# Patient Record
Sex: Female | Born: 1965 | Race: White | Hispanic: No | Marital: Married | Smoking: Never smoker
Health system: Southern US, Community
[De-identification: ages and names within clinical notes are randomized; demographics above are authoritative.]

## PROBLEM LIST (undated history)

## (undated) DIAGNOSIS — F32A Depression, unspecified: Secondary | ICD-10-CM

## (undated) DIAGNOSIS — F419 Anxiety disorder, unspecified: Secondary | ICD-10-CM

## (undated) DIAGNOSIS — T7840XA Allergy, unspecified, initial encounter: Secondary | ICD-10-CM

## (undated) DIAGNOSIS — D2362 Other benign neoplasm of skin of left upper limb, including shoulder: Secondary | ICD-10-CM

## (undated) DIAGNOSIS — L719 Rosacea, unspecified: Secondary | ICD-10-CM

## (undated) DIAGNOSIS — F329 Major depressive disorder, single episode, unspecified: Secondary | ICD-10-CM

## (undated) HISTORY — DX: Anxiety disorder, unspecified: F41.9

## (undated) HISTORY — DX: Allergy, unspecified, initial encounter: T78.40XA

## (undated) HISTORY — PX: COSMETIC SURGERY: SHX468

## (undated) HISTORY — PX: COLONOSCOPY: SHX174

## (undated) HISTORY — PX: ABDOMINAL HYSTERECTOMY: SHX81

## (undated) HISTORY — DX: Depression, unspecified: F32.A

## (undated) HISTORY — PX: LAPAROSCOPY: SHX197

## (undated) HISTORY — DX: Major depressive disorder, single episode, unspecified: F32.9

## (undated) HISTORY — DX: Rosacea, unspecified: L71.9

## (undated) HISTORY — DX: Other benign neoplasm of skin of left upper limb, including shoulder: D23.62

---
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
## 1985-03-21 HISTORY — PX: PLACEMENT OF BREAST IMPLANTS: SHX6334

## 1998-06-05 ENCOUNTER — Observation Stay (HOSPITAL_COMMUNITY): Admission: AD | Admit: 1998-06-05 | Discharge: 1998-06-06 | Payer: Self-pay | Admitting: Obstetrics and Gynecology

## 1998-06-09 ENCOUNTER — Inpatient Hospital Stay (HOSPITAL_COMMUNITY): Admission: AD | Admit: 1998-06-09 | Discharge: 1998-06-13 | Payer: Self-pay | Admitting: Obstetrics and Gynecology

## 1998-06-13 ENCOUNTER — Encounter (HOSPITAL_COMMUNITY): Admission: RE | Admit: 1998-06-13 | Discharge: 1998-09-11 | Payer: Self-pay | Admitting: Obstetrics and Gynecology

## 1999-06-11 ENCOUNTER — Ambulatory Visit (HOSPITAL_COMMUNITY): Admission: RE | Admit: 1999-06-11 | Discharge: 1999-06-11 | Payer: Self-pay | Admitting: *Deleted

## 1999-08-26 ENCOUNTER — Other Ambulatory Visit: Admission: RE | Admit: 1999-08-26 | Discharge: 1999-08-26 | Payer: Self-pay | Admitting: Obstetrics and Gynecology

## 2000-06-03 ENCOUNTER — Inpatient Hospital Stay (HOSPITAL_COMMUNITY): Admission: AD | Admit: 2000-06-03 | Discharge: 2000-06-03 | Payer: Self-pay | Admitting: Obstetrics and Gynecology

## 2000-08-31 ENCOUNTER — Inpatient Hospital Stay (HOSPITAL_COMMUNITY): Admission: AD | Admit: 2000-08-31 | Discharge: 2000-09-03 | Payer: Self-pay | Admitting: Obstetrics and Gynecology

## 2000-10-12 ENCOUNTER — Other Ambulatory Visit: Admission: RE | Admit: 2000-10-12 | Discharge: 2000-10-12 | Payer: Self-pay | Admitting: Obstetrics and Gynecology

## 2001-10-18 ENCOUNTER — Other Ambulatory Visit: Admission: RE | Admit: 2001-10-18 | Discharge: 2001-10-18 | Payer: Self-pay | Admitting: Obstetrics and Gynecology

## 2002-03-21 HISTORY — PX: VAGINAL HYSTERECTOMY: SHX2639

## 2002-10-23 ENCOUNTER — Other Ambulatory Visit: Admission: RE | Admit: 2002-10-23 | Discharge: 2002-10-23 | Payer: Self-pay | Admitting: Obstetrics and Gynecology

## 2003-01-28 ENCOUNTER — Inpatient Hospital Stay (HOSPITAL_COMMUNITY): Admission: AD | Admit: 2003-01-28 | Discharge: 2003-01-31 | Payer: Self-pay | Admitting: Obstetrics and Gynecology

## 2003-01-28 ENCOUNTER — Encounter (INDEPENDENT_AMBULATORY_CARE_PROVIDER_SITE_OTHER): Payer: Self-pay | Admitting: Specialist

## 2004-01-06 ENCOUNTER — Other Ambulatory Visit: Admission: RE | Admit: 2004-01-06 | Discharge: 2004-01-06 | Payer: Self-pay | Admitting: Obstetrics and Gynecology

## 2004-06-17 ENCOUNTER — Encounter: Admission: RE | Admit: 2004-06-17 | Discharge: 2004-06-17 | Payer: Self-pay | Admitting: Obstetrics and Gynecology

## 2005-02-02 ENCOUNTER — Other Ambulatory Visit: Admission: RE | Admit: 2005-02-02 | Discharge: 2005-02-02 | Payer: Self-pay | Admitting: Obstetrics and Gynecology

## 2006-03-02 ENCOUNTER — Encounter: Admission: RE | Admit: 2006-03-02 | Discharge: 2006-03-02 | Payer: Self-pay | Admitting: Obstetrics and Gynecology

## 2007-03-26 ENCOUNTER — Encounter: Admission: RE | Admit: 2007-03-26 | Discharge: 2007-03-26 | Payer: Self-pay | Admitting: Obstetrics and Gynecology

## 2007-04-27 ENCOUNTER — Ambulatory Visit: Payer: Self-pay | Admitting: Internal Medicine

## 2007-04-27 DIAGNOSIS — F411 Generalized anxiety disorder: Secondary | ICD-10-CM

## 2007-04-27 DIAGNOSIS — M79609 Pain in unspecified limb: Secondary | ICD-10-CM

## 2007-04-27 DIAGNOSIS — F419 Anxiety disorder, unspecified: Secondary | ICD-10-CM | POA: Insufficient documentation

## 2008-05-05 ENCOUNTER — Encounter: Admission: RE | Admit: 2008-05-05 | Discharge: 2008-05-05 | Payer: Self-pay | Admitting: Obstetrics and Gynecology

## 2008-06-06 ENCOUNTER — Ambulatory Visit: Payer: Self-pay | Admitting: Internal Medicine

## 2008-06-06 DIAGNOSIS — M542 Cervicalgia: Secondary | ICD-10-CM

## 2008-07-01 ENCOUNTER — Encounter: Payer: Self-pay | Admitting: Internal Medicine

## 2008-07-30 ENCOUNTER — Encounter
Admission: RE | Admit: 2008-07-30 | Discharge: 2008-10-28 | Payer: Self-pay | Admitting: Physical Medicine & Rehabilitation

## 2008-08-04 ENCOUNTER — Ambulatory Visit: Payer: Self-pay | Admitting: Physical Medicine & Rehabilitation

## 2008-08-07 ENCOUNTER — Encounter
Admission: RE | Admit: 2008-08-07 | Discharge: 2008-11-04 | Payer: Self-pay | Admitting: Physical Medicine & Rehabilitation

## 2008-08-21 ENCOUNTER — Ambulatory Visit (HOSPITAL_BASED_OUTPATIENT_CLINIC_OR_DEPARTMENT_OTHER)
Admission: RE | Admit: 2008-08-21 | Discharge: 2008-08-21 | Payer: Self-pay | Admitting: Physical Medicine & Rehabilitation

## 2008-08-21 ENCOUNTER — Ambulatory Visit: Payer: Self-pay | Admitting: Diagnostic Radiology

## 2008-09-09 ENCOUNTER — Ambulatory Visit: Payer: Self-pay | Admitting: Physical Medicine & Rehabilitation

## 2008-10-21 ENCOUNTER — Ambulatory Visit: Payer: Self-pay | Admitting: Physical Medicine & Rehabilitation

## 2009-05-11 ENCOUNTER — Encounter: Admission: RE | Admit: 2009-05-11 | Discharge: 2009-05-11 | Payer: Self-pay | Admitting: Obstetrics and Gynecology

## 2009-08-21 ENCOUNTER — Ambulatory Visit: Payer: Self-pay | Admitting: Internal Medicine

## 2009-08-21 DIAGNOSIS — R1013 Epigastric pain: Secondary | ICD-10-CM

## 2009-08-21 DIAGNOSIS — K3189 Other diseases of stomach and duodenum: Secondary | ICD-10-CM | POA: Insufficient documentation

## 2009-08-21 LAB — CONVERTED CEMR LAB
AST: 18 units/L (ref 0–37)
Alkaline Phosphatase: 107 units/L (ref 39–117)
Amylase: 49 units/L (ref 27–131)
Basophils Absolute: 0 10*3/uL (ref 0.0–0.1)
Bilirubin Urine: NEGATIVE
CO2: 31 meq/L (ref 19–32)
Chloride: 103 meq/L (ref 96–112)
Creatinine, Ser: 0.7 mg/dL (ref 0.4–1.2)
Eosinophils Relative: 6.4 % — ABNORMAL HIGH (ref 0.0–5.0)
GFR calc non Af Amer: 104.37 mL/min (ref >60)
HCT: 41.9 % (ref 36.0–46.0)
Ketones, urine, test strip: NEGATIVE
Lymphocytes Relative: 21.1 % (ref 12.0–46.0)
MCHC: 34.7 g/dL (ref 30.0–36.0)
Neutrophils Relative %: 66.3 % (ref 43.0–77.0)
Platelets: 379 10*3/uL (ref 150.0–400.0)
RDW: 12.6 % (ref 11.5–14.6)
Sodium: 140 meq/L (ref 135–145)
Total Bilirubin: 0.4 mg/dL (ref 0.3–1.2)

## 2009-08-23 ENCOUNTER — Telehealth (INDEPENDENT_AMBULATORY_CARE_PROVIDER_SITE_OTHER): Payer: Self-pay | Admitting: *Deleted

## 2009-08-25 ENCOUNTER — Ambulatory Visit: Payer: Self-pay | Admitting: Internal Medicine

## 2009-08-26 ENCOUNTER — Encounter: Admission: RE | Admit: 2009-08-26 | Discharge: 2009-08-26 | Payer: Self-pay | Admitting: Internal Medicine

## 2009-08-28 ENCOUNTER — Telehealth (INDEPENDENT_AMBULATORY_CARE_PROVIDER_SITE_OTHER): Payer: Self-pay | Admitting: *Deleted

## 2009-09-04 ENCOUNTER — Ambulatory Visit: Payer: Self-pay | Admitting: Internal Medicine

## 2010-02-04 ENCOUNTER — Encounter: Payer: Self-pay | Admitting: Internal Medicine

## 2010-03-21 HISTORY — PX: BREAST CYST ASPIRATION: SHX578

## 2010-04-10 ENCOUNTER — Other Ambulatory Visit: Payer: Self-pay | Admitting: Obstetrics and Gynecology

## 2010-04-10 DIAGNOSIS — Z1239 Encounter for other screening for malignant neoplasm of breast: Secondary | ICD-10-CM

## 2010-04-11 ENCOUNTER — Encounter: Payer: Self-pay | Admitting: Internal Medicine

## 2010-04-11 ENCOUNTER — Encounter: Payer: Self-pay | Admitting: Obstetrics and Gynecology

## 2010-04-22 NOTE — Assessment & Plan Note (Signed)
Summary: rto 2 weeks.cbs   Vital Signs:  Patient profile:   45 year old female Height:      63 inches Weight:      144 pounds Temp:     99.0 degrees F oral Pulse rate:   78 / minute BP sitting:   100 / 82  (left arm)  Vitals Entered By: Jeremy Johann CMA (September 04, 2009 1:53 PM) CC: 2 week f/u Comments --better no more episodes REVIEWED MED LIST, PATIENT AGREED DOSE AND INSTRUCTION CORRECT    History of Present Illness: followup from previous visit Upper abdominal crampy pain resolved No diarrhea She decided not to take Nexium  Allergies: 1)  ! * Cephalasporin 2)  ! Pcn  Past History:  Past Medical History: Reviewed history from 06/06/2008 and no changes required. 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 Hysterectomy (2004) no oophorectomy  Social History: Reviewed history from 06/06/2008 and no changes required. 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  Physical Exam  General:  alert, well-developed, and well-nourished.   Abdomen:  soft, non-tender, no distention, no masses, no guarding, and no rigidity.     Impression & Recommendations:  Problem # 1:  DYSPEPSIA (ICD-536.8) improving upper crampy abdominal pain resolved Still bloated from time to time Workup negative except for x-ray that showed abundant feces. We discussed a healthy diet, avoid the type of foods that cause bloating. recommend to avoid excessive fiber supplements, always take fiber w/  water, try to eat more fruits and vegetables. The patient will let me know if she's not completely well in 2 months for a GI referral. EGD/colonoscopy?  Complete Medication List: 1)  Effexor Xr  75 Mg Cp24 (Venlafaxine hcl) .Marland Kitchen.. 1 by mouth qd 2)  Retin-a  3)  Finacea 15 % Gel (Azelaic acid) 4)  Oracea 40 Mg Cpdr (Doxycycline (rosacea)) .... Take once daily 5)  Wellbutrin 75 Mg Tabs (Bupropion hcl) .... Take 1 tab once daily

## 2010-04-22 NOTE — Progress Notes (Signed)
Summary: lab result  Phone Note Outgoing Call   Call placed by: [facility] CMA,  August 28, 2009 10:28 AM Details for Reason: advised patient X-rays an ultrasound are essentially normal (?constipation) plan is the same, come back in 2 weeks if she feels constipated, needs to take milk of magnesia over-the-counter as needed Summary of Call: left message to call office..........Marland KitchenFelecia Deloach CMA  August 28, 2009 10:28 AM  DISCUSS WITH PATIENT, letter mailed ..........Marland KitchenFelecia Deloach CMA  August 28, 2009 12:06 PM

## 2010-04-22 NOTE — Assessment & Plan Note (Signed)
Summary: stomach issues//lch   Vital Signs:  Patient profile:   45 year old female Height:      63 inches Weight:      141 pounds BMI:     25.07 Temp:     98.8 degrees F oral Pulse rate:   66 / minute BP sitting:   122 / 98  (left arm)  Vitals Entered By: Jeremy Johann CMA (August 21, 2009 9:42 AM) CC: stomach pain/ cramping x43month Comments --NVD --bloating --chills and sweats -REVIEWED MED LIST, PATIENT AGREED DOSE AND INSTRUCTION CORRECT    History of Present Illness: one month history of on and off upper abdominal pain The pain is located in the center of the upper abdomen, crampy, may last hours, apparently not triggered by any particular food. She also had diarrhea on and off, less frequent than the pain. pain is associated with upper abdominal bloating Stools are nonbloody and watery. She had nausea and vomiting for the first time yesterday. The only new medication that she is taking  Oracea  for rosacea  review of systems: Denies fevers No weight loss No GERD symptoms, stools normal in color, no blood in the stools. No  classic heartburn No dysuria or gross hematuria Denies using Motrin or Motrin-like medicines She had a hysterectomy, some hot flashes  Allergies: 1)  ! * Cephalasporin 2)  ! Pcn  Past History:  Past Medical History: Reviewed history from 06/06/2008 and no changes required. 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  no apparent distress Eyes:  not pale or jaundice Lungs:  clear  bilaterally Heart:  regular rate and rhythm without murmur Abdomen:  not distended, soft, tender at the epigastric area without mass or  rebound. Not tender at the lower aspect of the abdomen. No tender at the right upper quadrant. Bowel sounds normal, slightly metallic?   Impression & Recommendations:  Problem # 1:  DYSPEPSIA (ICD-536.8) on an off upper abdominal cramping with epigastric tenderness for the last month She has developed nausea and vomiting for only one day GERD? Peptic ulcer disease? Gallbladder? Plan: Abdominal x-rays Ultrasound Trial with PPIs Labs ER if symptoms severe Return to the office in 2 weeks  Orders: T-Abdomen 2-view (74020TC) Venipuncture (57846) TLB-BMP (Basic Metabolic Panel-BMET) (80048-METABOL) TLB-CBC Platelet - w/Differential (85025-CBCD) TLB-Hepatic/Liver Function Pnl (80076-HEPATIC) TLB-Lipase (83690-LIPASE) TLB-Amylase (82150-AMYL) UA Dipstick w/o Micro (manual) (96295) Radiology Referral (Radiology)  Complete Medication List: 1)  Effexor Xr 75 Mg Cp24 (Venlafaxine hcl) .Marland Kitchen.. 1 by mouth qd 2)  Retin-a  3)  Finacea 15 % Gel (Azelaic acid) 4)  Oracea 40 Mg Cpdr (Doxycycline (rosacea)) .... Take once daily 5)  Wellbutrin 75 Mg Tabs (Bupropion hcl) .... Take 1 tab once daily 6)  Nexium 40 Mg Cpdr (Esomeprazole magnesium) .... One by mouth daily before breakfast  Patient Instructions: 1)  Please schedule a follow-up appointment in 2 weeks.  Prescriptions: NEXIUM 40 MG CPDR (ESOMEPRAZOLE MAGNESIUM) one by mouth daily before breakfast  #30 x 1   Entered and Authorized by:   Elita Quick E. Cohen Doleman MD   Signed by:   Nolon Rod. Camryn Lampson MD on 08/21/2009   Method used:   Print  then Give to Patient   RxID:   [id number]   Laboratory Results   Urine Tests   Date/Time Reported: August 21, 2009 10:26 AM  Routine Urinalysis   Color: yellow Appearance: Clear Glucose: negative   (Normal Range: Negative) Bilirubin: negative   (Normal Range: Negative) Ketone: negative   (Normal Range: Negative) Spec. Gravity: 1.010   (Normal Range: 1.003-1.035) Blood: negative   (Normal Range:  Negative) pH: 7.5   (Normal Range: 5.0-8.0) Protein: negative   (Normal Range: Negative) Urobilinogen: 0.2   (Normal Range: 0-1) Nitrite: negative   (Normal Range: Negative) Leukocyte Esterace: negative   (Normal Range: Negative)

## 2010-04-22 NOTE — Miscellaneous (Signed)
Summary: Immunization Entry-Flu Vacc   Immunization History:  Influenza Immunization History:    Influenza:  historical (02/02/2010) Patient received flu vacc at [facility] on 02/02/10. Lucious Groves CMA  February 04, 2010 8:54 AM

## 2010-04-22 NOTE — Progress Notes (Signed)
Summary:  labs   Phone Note Outgoing Call Call back at [phone]   Summary of Call: labs normal XRs abnormal, see report plan: call patient  let me know if no better or if she is worse schedule an addominal u/s repat XR of the abdomen June 7  Jose E. Paz MD  August 23, 2009 5:44 PM   Follow-up for Phone Call        left message to call  office...............Marland KitchenFelecia Deloach CMA  August 24, 2009 8:33 AM  pt aware, REFERRAL PUT IN..............Marland KitchenFelecia Deloach CMA  August 24, 2009 8:51 AM

## 2010-05-12 ENCOUNTER — Ambulatory Visit
Admission: RE | Admit: 2010-05-12 | Discharge: 2010-05-12 | Disposition: A | Discharge status: Home / Self Care | Payer: BC Managed Care – PPO | Source: Ambulatory Visit | Attending: Obstetrics and Gynecology | Admitting: Obstetrics and Gynecology

## 2010-05-12 DIAGNOSIS — Z1239 Encounter for other screening for malignant neoplasm of breast: Secondary | ICD-10-CM

## 2010-05-14 ENCOUNTER — Other Ambulatory Visit: Payer: Self-pay | Admitting: Obstetrics and Gynecology

## 2010-05-14 DIAGNOSIS — R928 Other abnormal and inconclusive findings on diagnostic imaging of breast: Secondary | ICD-10-CM

## 2010-05-25 ENCOUNTER — Ambulatory Visit
Admission: RE | Admit: 2010-05-25 | Discharge: 2010-05-25 | Disposition: A | Discharge status: Home / Self Care | Payer: BC Managed Care – PPO | Source: Ambulatory Visit | Attending: Obstetrics and Gynecology | Admitting: Obstetrics and Gynecology

## 2010-05-25 DIAGNOSIS — R928 Other abnormal and inconclusive findings on diagnostic imaging of breast: Secondary | ICD-10-CM

## 2010-08-03 NOTE — Procedures (Signed)
Audrey Wright, Audrey Wright                ACCOUNT NO.:  [id number]   MEDICAL RECORD NO.:  [id number]           PATIENT TYPE:   LOCATION:                                 FACILITY:   PHYSICIAN:  Erick Colace, M.D.DATE OF BIRTH:  Jul 29, 1965   DATE OF PROCEDURE:  DATE OF DISCHARGE:                               OPERATIVE REPORT   This is a trigger point injection, right upper trapezius three  locations, indication of myofascial pain syndrome only partial response  to medication management.  The area was marked, prepped with Betadine,  alcohol, entered with 25-gauge inch and half needle, 0.5 mL of 1%  lidocaine inject into each site in a fan-like distribution.  The patient  tolerated the procedure well.  All injections done after a negative  drawback for blood.      Erick Colace, M.D.  Electronically Signed     AEK/MEDQ  D:  08/04/2008 16:27:37  T:  08/05/2008 07:22:05  Job:  [id number]

## 2010-08-03 NOTE — Assessment & Plan Note (Signed)
A 45 year old with history of neck pain without history of trauma.  We  did x-rays on her after initial visit on Aug 04, 2008, and these were  normal.  She started some physical therapy, which has been helpful for  her.  They have been doing some range of motion stretching and manual  therapy.  In addition, she has trialed some trigger point injections at  this office, but these were not particularly helpful.   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  Her pain is described as  intermittent and aching.  Her sleep is good.   Her blood pressure is 127/72, pulse 80, respirations 18, and O2 sat 99%  on room air.  Her Oswestry score is 16%.   Her neck range of motion is full.  She has some tenderness in the right  upper trapezius.  No pain over the scalenes.  No pain over the spinous  prosthesis.  Negative Spurling maneuver.  Upper extremity strength is  5/5.  She does have some deficiency at the right pectoralis to her  [place] syndrome.   IMPRESSION:  Neck pain as well as shoulder girdle pain, responding to  physical therapy.  I think this is mainly a biomechanical problem.  At  this point, I do not think any further workup is needed.  We will treat  with physical therapy, trial TENS unit, and trial Voltaren gel.  I will  see her back in about 2 months' time.      Erick Colace, M.D.  Electronically Signed     AEK/MedQ  D:  09/09/2008 12:11:09  T:  09/10/2008 01:39:32  Job #:  [id number]

## 2010-08-03 NOTE — Consult Note (Signed)
CONSULT REQUEST FOR EVALUATION:  Neck pain, right sided.   CHIEF COMPLAINT:  Neck pain that goes up to the back of the head as well  as down in the shoulder blade area.   A 45 year old female who has 7-year history of neck pain without a  history of trauma.  She has had massage therapy which has been partially  helpful, acupuncture which has not been helpful, and yoga which actually  made it worse.  She has not tried any physical therapy or TENS unit.  She has not had any cervical imaging study.  She points to the area in  her upper trapezius.  She states this started after the birth of one of  her children.  She thinks it was from carrying around a heavy diaper  bag.  Her average pain is 3/10, currently 2/10.  Her sleep is good.  Pain does not interfere with her activities.  Pain is worse with talking  on the telephone.  Relief from meds is good.  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   REVIEW OF SYSTEMS:  Otherwise negative.  She denies any arm pain or  weakness.  No numbness or tingling in the upper extremities.  No trouble  with walking.  No bowel or bladder dysfunction.   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  She has undergone a right  breast implant in 1983 for this.  She also has a history of uterine  bleeding and underwent vaginal hysterectomy.  She has undergone  liposuction in 2004.   SOCIAL HISTORY:  Married, lives with her husband and children.  She has  2-3 alcoholic beverages per week, nonsmoker, no illegal drug use.   FAMILY HISTORY:  Cancer.   MEDICATIONS:  She takes Effexor 75 mg 2 tablets per day for depression  and anxiety.  She uses MetroGel and Finacea for rosacea.   PHYSICAL EXAMINATION:  VITAL SIGNS:  Her blood pressure is 115/66, pulse  82, respirations 18, O2 sat 99% on room air.  GENERAL:  Well-developed, well-nourished female in no acute stress.  Orientation  x3.  Affect alert.  Gait is normal.  She is able to heel  walk, toe walk.  EXTREMITIES:  Without edema.  Coordination in the upper and lower  extremities is normal.  Deep tendon reflexes are normal in the upper and  lower extremities.  Sensation is normal in the upper and lower  extremities.  Her upper extremity range of motion is full.  She has  normal strength in the upper and lower extremities.  NECK:  Her neck has full range of motion.  She has some pain with left  lateral bending.  She does feel more comfortable keeping her head tilted  slightly toward the right side.   She has no tenderness over the scalene muscles or the  sternocleidomastoid muscle.  She has tenderness over the upper trapezius  muscle on the right side only and somewhat along the upper medial  scapular border.  She has no evidence of periscapular wasting.   Her sternocostal area shows concavity in the upper pectoral region.   IMPRESSION:  1. Right-sided neck and upper trapezius pain.  This is most likely a      myofascial pain syndrome.  The differential diagnosis also includes      cervical spondylosis with facet arthropathy.  This would explain      some  of the radiation patterns; however, she does not seem to have      any exacerbation with extension.  2. Cervical dystonia.  This is a more rare condition; however, she      does have some abnormal neck positioning and quite significant      increase in tone of her trapezius more so than it is usual with      myofascial pain syndrome.   RECOMMENDATIONS:  I will go ahead and do trigger point injection of her  upper trapezius today.  We will send her to physical therapy for muscle  management program with the use of TENS as well as show her some range  of motion exercises as well strengthening of her upper back musculature.   I will review her x-ray with her when I next see her.  We will see how  she does with the above treatment plan, consider all other  treatments  based on diagnostic imaging as well as response to therapy.   Thank you for this interesting consultation.  Keep you apprised of her  progress.      Erick Colace, M.D.  Electronically Signed     AEK/MedQ  D:08/04/2008 16:25:13  T:08/05/2008 06:16:40  Job #:  [id number]   cc:   Dr. Stacie Glaze

## 2010-08-03 NOTE — Assessment & Plan Note (Signed)
Audrey Wright is a 45 year old female with neck pain history without history  of Crohn mainly right upper trap pain.  She has a history of deficiency  right pectoralis muscle last seen by me September 09, 2008.  Her pain does  interfere with activity only at a 1/10 level.  Sleep is good.  Therapeutic massage does help her upper trap.  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   She has trialed a TENS unit with physical therapy.  This has been  helpful.  Voltaren gel has not been helpful.   PHYSICAL EXAMINATION:  VITAL SIGNS:  Her blood pressure 122/77, pulse  77, respirations 18, O2 sat 100% on room air.  GENERAL:  No acute distress.  Orientation x3.  Affect alert.  Gait is  normal.  EXTREMITIES:  Her upper extremity strength is full.  She does have some  reduction of a pectoralis both on the right compared to her left side.  She has full strength, however, in the upper extremities, deltoid,  biceps, triceps grip as well as external rotators.  She has some  tenderness and some tightness to palpation in the right upper trap.   IMPRESSION:  1. Myofascial pain syndrome, right upper trap.  I think she could      benefit from further myofascial release deep massage.  We will send      her only integrated therapy for that.  2. Questioned she has a dystonia in the right upper trap, would be      rather localized may benefit from Botox.  We will discuss this      further should the additional manual therapy not be of any use of      any help.      Erick Colace, M.D.  Electronically Signed     AEK/MedQ  D:  10/21/2008 12:55:39  T:  10/22/2008 02:23:59  Job #:  [id number]

## 2010-08-06 NOTE — Discharge Summary (Signed)
Audrey Wright, Audrey Wright                          ACCOUNT NO.:  [id number]   MEDICAL RECORD NO.:  [id number]                   PATIENT TYPE:  INP   LOCATION:  9306                                 FACILITY:  [facility]   PHYSICIAN:  Guy Sandifer. Arleta Creek, M.D.           DATE OF BIRTH:  03-15-1966   DATE OF ADMISSION:  01/28/2003  DATE OF DISCHARGE:  01/31/2003                                 DISCHARGE SUMMARY   ADMITTING DIAGNOSES:  1. Pelvic relaxation.  2. Stress urinary incontinence.  3. Excess fat anterior abdomen.  4. Excess fat bilateral inner thighs.   PROCEDURE:  On January 28, 2003 total vaginal hysterectomy, mid urethral  sling with PelviLace, anterior/posterior vaginal repair with Pelvicol and  PelviSoft, and power assisted liposuction of the anterior abdomen, bilateral  inner thighs per Alfredia Ferguson, M.D.   REASON FOR ADMISSION:  This patient is a 45 year old married white female  G3, P2, husband status post vasectomy with increasingly symptomatic stress  urinary incontinence.  After discussing the options she is admitted for  surgical management.   HOSPITAL COURSE:  The patient is admitted to the hospital.  Undergoes the  above procedures.  On the evening of the surgery she has good pain relief,  no nausea/vomiting.  Vital signs are stable.  She is afebrile with clear  urine output.  On the first postoperative day she is passing flatus and  tolerating regular diet with good pain control.  White count is 8.7,  hemoglobin 9.4.  Vital signs are stable and she is afebrile.  On the second  postoperative day she is tolerating regular diet.  Has had some mild nausea  with ibuprofen.  Temperature was 102.4 at approximately 6 a.m. and 101.6 at  7:45 a.m.  Pulse was 100, regular, blood pressure 109/68, respiratory rate  18.  She had been noted to have a decreased oxygen saturation on pulse  oximetry on room air.  On 6 L of oxygen per nasal cannula her O2 saturation  was 98%.  On  examination she had crackles at the left base and decreased  breath sounds at the right base.  Arterial blood gases revealed a pH of  7.42, pCO2 of 39, and a pO2 of 58.1.  Repeat CBC revealed a white count of  8.6 and hemoglobin of 8.9 with 85% neutrophils.  A chest x-ray was  compatible with bibasilar atelectasis and small bilateral pleural effusions.  Abdominal x-rays were not obstructed.  The patient was then started on  albuterol breathing treatments.  Oxygen was also continued.  She had an  immediate response to this and her O2 saturations went to 99-100% on 2 L of  oxygen per nasal cannula.  Repeat ABGs had a pH of 7.396, pO2 38.8, and pO2  of 99.9.  Later that evening vital signs were stable.  Temperature was  approximately 99.5.  She continued to have a few crackles at  the bases.  Was  otherwise ambulating well.  Respiratory treatments were continued around the  clock and incentive spirometry and ambulation and IV fluids were continued.  The patient was also receiving Cipro intravenously.  The PCA was  discontinued.  The next morning she had no shortness of breath, good pain  relief, was passing flatus.  Temperature was 100.1.  She was observed  throughout the day and continued to do quite well and remained afebrile.  It  was felt that her fever was secondary to atelectasis with prompt response to  respiratory treatment.  She was discharged home later that day in good  condition.   DIET:  Regular, as tolerated.   ACTIVITY:  No lifting.  No operation of automobiles.  No vaginal entry.   FOLLOWUP:  In the office in one week.  The patient was unable to void and  her Foley catheter was replaced.  Catheter care was taught.   MEDICATIONS:  1. Levaquin 500 mg #7 one p.o. daily.  2. Percocet 5/325 mg #30 one to two p.o. q.6h. p.r.n.  3. Chromagen #30 one p.o. daily, one refill.                                               Guy Sandifer Arleta Creek, M.D.    JET/MEDQ  D:  02/21/2003   T:  02/21/2003  Job:  [id number]   cc:   Alfredia Ferguson, M.D.  [address] [address]  [place] [address]  Fax: [phone]
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
## 2010-08-06 NOTE — Op Note (Signed)
Audrey Wright, Audrey Wright                          ACCOUNT NO.:  [id number]   MEDICAL RECORD NO.:  [id number]                   PATIENT TYPE:  INP   LOCATION:  9199                                 FACILITY:  [facility]   PHYSICIAN:  Guy Sandifer. Arleta Creek, M.D.           DATE OF BIRTH:  Jan 12, 1966   DATE OF PROCEDURE:  01/28/2003  DATE OF DISCHARGE:                                 OPERATIVE REPORT   PREOPERATIVE DIAGNOSES:  1. Pelvic relaxation.  2. Stress urinary incontinence.   POSTOPERATIVE DIAGNOSES:  1. Pelvic relaxation.  2. Stress urinary incontinence.   PROCEDURES:  1. Total vaginal hysterectomy.  2. Mid urethral sling with PelviLace.  3. Anterior posterior vaginal repair with Pelvicol and PelviSoft.   SURGEON:  Guy Sandifer. Henderson Cloud, M.D.   ASSISTANT:  Randye Lobo, M.D.   ANESTHESIA:  General with endotracheal intubation.   ESTIMATED BLOOD LOSS:  175 mL.   SPECIMENS:  Uterus.   INDICATIONS FOR PROCEDURE:  This patient is a 45 year old married white  female, G3, P2, husband status post vasectomy, with increasing symptomatic  pelvic relaxation and stress urinary incontinence.  Details are dictated in  the history and physical.  Total vaginal hysterectomy with mid urethral  sling, anterior and posterior vaginal repair with Pelvicol graphs has been  discussed preoperatively.  Potential risks and complications have been  discussed preoperatively including, but not limited to, infection, bowel,  bladder or ureteral damage, bleeding requiring transfusion of blood products  with possible transfusion reaction, HIV and hepatitis acquisition, DVT, PE,  pneumonia, vaginal narrowing, postoperative dyspareunia, laparotomy,  laparoscopy, recurrent stress incontinence and/or pelvic relaxation.  All  questions have been answered and consent is signed on the chart.   DESCRIPTION OF PROCEDURE:  The patient is taken to the operating room and  placed in the dorsal supine position where general  anesthesia is induced via  endotracheal intubation.  She is then placed in the dorsal lithotomy  position where she is prepped.  Bladder is straight catheterized and she is  draped in a sterile fashion.  Weighted speculum is placed in the vagina and  the posterior cul-de-sac is entered sharply without difficulty.  Cervix is  circumscribed with a cautery.  Mucosa is advanced sharply and bluntly.  Uterosacral ligaments are taken down bilaterally and are ligated with  transection sutures of 0 Monocryl.  All suture will be 0 Monocryl unless  otherwise designated.  Bladder pillars are then taken down bilaterally.  The  anterior cul-de-sac is entered without difficulty.  The cardinal ligaments  followed by the uterine vessels are taken down bilaterally.  Additional bite  above the uterine vessels is taken bilaterally.  Fundus is delivered  posteriorly.  Proximal ligaments are clamped and specimen is removed.  The  pedicles are doubly ligated with a free tie then with a suture.  Careful  inspection reveals excellent hemostasis.  Uterosacral ligaments are then  plicated  to the vaginal cuff bilaterally. They are then plicated in the  midline with two separate sutures.  Posterior half of the vaginal cuff is  closed with figure-of-eights.  The anterior repair is then carried out by  first infiltrating the vaginal mucosa with 0.5% lidocaine with epinephrine.  The vaginal mucosa is then dissected and taken down in the midline to a  point approximately 1 cm below the urethra.  This is then carried out  widely, sharply and bluntly.  This is carried up anteriorly to the  urogenital diaphragm but not penetrating urogenital diaphragm.  The midline  is identified above the prepuce and an incision is made one fingerbreadth  bilaterally, lateral to the midline just above the symphysis pubis.  A Foley  catheter is placed in the bladder as a drain and also to mark the course of  he urethra.  The Bard curved  needle is then passed retropubically through  the urogenital diaphragm bilaterally without difficulty.  Foley catheter is  removed.  Cystoscopy is then carried out using the 70 degree cystoscope.  The dome of the bladder is identified via the bubble and the course of both  needles are completely surveyed from top to bottom.  There is no evidence of  perforation.  The ureteral orifices are identified bilaterally and a good  puff of urine is noted on both sides.  The remainder of the bladder is  surveyed and there are no foreign bodies.  The cystoscope is removed and the  Foley catheter is replaced and the bladder is drained.  The PelviLace is  then attached to the needles bilaterally and is withdrawn through the skin  incisions.  Tension is carefully evaluated.  The graft can readily be seen  through the incision.  A Kelly clamp can be placed behind the sling and  easily raised perpendicular to the floor.  The sling is just barely an even  contact with the suburethral tissues but is not tight.  Good hemostasis is  noted as well.  The cystocele is reduced with a figure-of-eight.  A portion  of Pelvicol is then trimmed to fit and is anchored to the subvaginal tissues  in the apical corners bilaterally with mattress sutures and the Pelvicol  graft is placed.  This is additionally anchored with a suture at each  anterior angle as well.  This gives good placement.  The anterior half of  the vaginal cuff is closed with figure-of-eights.  The anterior vaginal  mucosa is trimmed and enclosed in a running locking fashion with 2-0  Monocryl suture.  Posterior repair is then carried out by first removing a  small diamond shaped wedge of tissue from the perineal body.  The posterior  mucosa is infiltrated with 0.5% lidocaine with epinephrine and then taken  down to the midline from the underlying rectum.  This dissection is then  carried out bilaterally, bluntly and sharply.  A portion of PelviSoft  is then anchored at the apical corners bilaterally with 0 Monocryl.  It is  additionally anchored with two sutures on each side and one in the midline  over the perineal body.  Excess mucosa is trimmed and then closed in running  locking fashion with 2-0 Monocryl suture down to the level of the perineal  body.  The perineal body is then dissected and reapproximated with 0  Monocryl sutures.  The 2-0 Monocryl is then continued on down in standard  episiotomy type fashion closing the mucosa as well.  A one inch plain pack  with estrogen cream is then placed in the vagina.  Foley catheter is still  in place.  The suprapubic injections are injected with 0.5% plain Marcaine  and closed with Dermabond.  The patient is stable and counts are correct at  this time.  Dr. Benna Dunks will now enter the room and perform his procedures.  These will be dictated under a separate note.                                               Guy Sandifer Arleta Creek, M.D.    JET/MEDQ  D:  01/28/2003  T:  01/28/2003  Job:  [id number]   cc:   Dr. Benna Dunks

## 2011-01-03 ENCOUNTER — Other Ambulatory Visit: Payer: Self-pay | Admitting: Obstetrics and Gynecology

## 2011-01-03 DIAGNOSIS — N6009 Solitary cyst of unspecified breast: Secondary | ICD-10-CM

## 2011-01-04 ENCOUNTER — Ambulatory Visit
Admission: RE | Admit: 2011-01-04 | Discharge: 2011-01-04 | Disposition: A | Discharge status: Home / Self Care | Payer: BC Managed Care – PPO | Source: Ambulatory Visit | Attending: Obstetrics and Gynecology | Admitting: Obstetrics and Gynecology

## 2011-01-04 ENCOUNTER — Other Ambulatory Visit: Payer: Self-pay | Admitting: Obstetrics and Gynecology

## 2011-01-04 DIAGNOSIS — N6009 Solitary cyst of unspecified breast: Secondary | ICD-10-CM

## 2011-01-11 ENCOUNTER — Other Ambulatory Visit: Payer: Self-pay | Admitting: Dermatology

## 2012-04-30 ENCOUNTER — Other Ambulatory Visit: Payer: Self-pay | Admitting: Obstetrics and Gynecology

## 2012-04-30 DIAGNOSIS — Z1239 Encounter for other screening for malignant neoplasm of breast: Secondary | ICD-10-CM

## 2012-05-08 ENCOUNTER — Ambulatory Visit
Admission: RE | Admit: 2012-05-08 | Discharge: 2012-05-08 | Disposition: A | Discharge status: Home / Self Care | Payer: BC Managed Care – PPO | Source: Ambulatory Visit | Attending: Obstetrics and Gynecology | Admitting: Obstetrics and Gynecology

## 2012-05-08 ENCOUNTER — Other Ambulatory Visit: Payer: Self-pay | Admitting: Obstetrics and Gynecology

## 2012-05-08 DIAGNOSIS — Z1239 Encounter for other screening for malignant neoplasm of breast: Secondary | ICD-10-CM

## 2013-03-21 HISTORY — PX: POLYPECTOMY: SHX149

## 2013-04-09 ENCOUNTER — Other Ambulatory Visit: Payer: Self-pay

## 2013-04-09 DIAGNOSIS — Z1231 Encounter for screening mammogram for malignant neoplasm of breast: Secondary | ICD-10-CM

## 2013-05-22 ENCOUNTER — Telehealth: Payer: Self-pay

## 2013-05-22 NOTE — Telephone Encounter (Signed)
Medication and allergies:  Reviewed and updated  90 day supply/mail order: na Local pharmacy: [facility] [address]   Immunizations due:  Tdap >10 years  A/P:   Entered FH, PSH and Personal Hx Flu vaccine--01/2013 Tdap--due Pap--scheduled Bone Density--schedule  To Discuss with Provider: Intermittent Left sided neck pain and arm pain x 3 weeks--no other symptoms

## 2013-05-23 ENCOUNTER — Ambulatory Visit
Admission: RE | Admit: 2013-05-23 | Discharge: 2013-05-23 | Disposition: A | Discharge status: Home / Self Care | Payer: BC Managed Care – PPO | Source: Ambulatory Visit

## 2013-05-23 ENCOUNTER — Ambulatory Visit (INDEPENDENT_AMBULATORY_CARE_PROVIDER_SITE_OTHER): Payer: BC Managed Care – PPO | Admitting: Internal Medicine

## 2013-05-23 ENCOUNTER — Encounter: Payer: Self-pay | Admitting: Internal Medicine

## 2013-05-23 VITALS — BP 128/72 | HR 68 | Temp 98.2°F | Ht 63.0 in | Wt 147.0 lb

## 2013-05-23 DIAGNOSIS — E559 Vitamin D deficiency, unspecified: Secondary | ICD-10-CM

## 2013-05-23 DIAGNOSIS — M542 Cervicalgia: Secondary | ICD-10-CM

## 2013-05-23 DIAGNOSIS — Z1231 Encounter for screening mammogram for malignant neoplasm of breast: Secondary | ICD-10-CM

## 2013-05-23 DIAGNOSIS — Z23 Encounter for immunization: Secondary | ICD-10-CM

## 2013-05-23 DIAGNOSIS — Z0000 Encounter for general adult medical examination without abnormal findings: Secondary | ICD-10-CM | POA: Insufficient documentation

## 2013-05-23 LAB — CBC WITH DIFFERENTIAL/PLATELET
BASOS ABS: 0 10*3/uL (ref 0.0–0.1)
Basophils Relative: 0.4 % (ref 0.0–3.0)
Eosinophils Absolute: 0 10*3/uL (ref 0.0–0.7)
Eosinophils Relative: 0.6 % (ref 0.0–5.0)
HEMATOCRIT: 44.2 % (ref 36.0–46.0)
Hemoglobin: 14.7 g/dL (ref 12.0–15.0)
LYMPHS ABS: 1.8 10*3/uL (ref 0.7–4.0)
Lymphocytes Relative: 25.8 % (ref 12.0–46.0)
MCHC: 33.2 g/dL (ref 30.0–36.0)
MCV: 91.6 fl (ref 78.0–100.0)
MONOS PCT: 5.1 % (ref 3.0–12.0)
Monocytes Absolute: 0.3 10*3/uL (ref 0.1–1.0)
NEUTROS PCT: 68.1 % (ref 43.0–77.0)
Neutro Abs: 4.6 10*3/uL (ref 1.4–7.7)
PLATELETS: 320 10*3/uL (ref 150.0–400.0)
RBC: 4.82 Mil/uL (ref 3.87–5.11)
RDW: 13.3 % (ref 11.5–14.6)
WBC: 6.8 10*3/uL (ref 4.5–10.5)

## 2013-05-23 LAB — COMPREHENSIVE METABOLIC PANEL
ALK PHOS: 85 U/L (ref 39–117)
ALT: 23 U/L (ref 0–35)
AST: 19 U/L (ref 0–37)
Albumin: 4.1 g/dL (ref 3.5–5.2)
BUN: 15 mg/dL (ref 6–23)
CHLORIDE: 105 meq/L (ref 96–112)
CO2: 25 mEq/L (ref 19–32)
Calcium: 9.3 mg/dL (ref 8.4–10.5)
Creatinine, Ser: 0.7 mg/dL (ref 0.4–1.2)
GFR: 99.18 mL/min (ref >60.00)
Glucose, Bld: 81 mg/dL (ref 70–99)
POTASSIUM: 3.8 meq/L (ref 3.5–5.1)
SODIUM: 136 meq/L (ref 135–145)
TOTAL PROTEIN: 7.3 g/dL (ref 6.0–8.3)
Total Bilirubin: 0.6 mg/dL (ref 0.3–1.2)

## 2013-05-23 LAB — LIPID PANEL
Cholesterol: 221 mg/dL — ABNORMAL HIGH (ref 0–200)
HDL: 76.2 mg/dL (ref >39.00)
LDL Cholesterol: 128 mg/dL — ABNORMAL HIGH (ref 0–99)
Total CHOL/HDL Ratio: 3
Triglycerides: 85 mg/dL (ref 0.0–149.0)
VLDL: 17 mg/dL (ref 0.0–40.0)

## 2013-05-23 LAB — TSH: TSH: 1.14 u[IU]/mL (ref 0.35–5.50)

## 2013-05-23 MED ORDER — CYCLOBENZAPRINE HCL 10 MG PO TABS: 10.0000 mg | ORAL_TABLET | Freq: Every evening | ORAL | Status: DC | PRN

## 2013-05-23 MED ORDER — PREDNISONE 10 MG PO TABS: ORAL_TABLET | ORAL | Status: DC

## 2013-05-23 NOTE — Assessment & Plan Note (Addendum)
Flu vaccine--01/2013  Tdap--today Female care per gyn  Pap--scheduled  Bone Density--scheduled   Never have a cscope , discussed different screening modalities, likes to be proactive and do a colonoscopy this year (turns 54)-- refer to GI  Diet-exercise discussed

## 2013-05-23 NOTE — Progress Notes (Signed)
Subjective:    Patient ID: Audrey Wright, female    DOB: April 08, 1965, 48 y.o.   MRN: [id number]  DOS:  05/23/2013 Type of  visit: New patient, last OV >3 years, CPX Reports a three-week history of left sided neck pain, on and off, not worse by moving the neck, denies headaches per se, no slurred speech, facial numbness or diplopia. Occasionally has some numbness at the triceps area but no hand numbness. Is not taking anything for this condition    ROS Diet-- trying to eat healthy Exercise-- limited d/t lack of time (kids, parents)  No  CP, SOB Denies  nausea, vomiting diarrhea  Denies  blood in the stools (-) cough, sputum production (-) wheezing, chest congestion No dysuria, gross hematuria, difficulty urinating  No anxiety, depression (on meds)   Past Medical History  Diagnosis Date  . Rosacea   . Anxiety and depression     Past Surgical History  Procedure Laterality Date  . Vaginal hysterectomy  2004  . Placement of breast implants Right 1987  . Laparoscopy      prior to 2000    History   Social History  . Marital Status: Married    Spouse Name: N/A    Number of Children: 2  . Years of Education: N/A   Occupational History  . works 30 h/week, 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     Social History Main Topics  . Smoking status: Not on file  . Smokeless tobacco: Never Used  . Alcohol Use: Yes     Comment: social wine   . Drug Use: No  . Sexual Activity: Not on file   Other Topics Concern  . Not on file   Social History Narrative  . No narrative on file     Family History  Problem Relation Age of Onset  . Hypertension Mother   . Hyperlipidemia Mother   . Breast cancer Maternal Grandmother     postmenopause breast  . Squamous cell carcinoma Mother   . Colon cancer Neg Hx   . CAD Neg Hx   . Pancreatic cancer Other     GF, mother side   . Stroke Neg Hx        Medication List       This list is accurate as of: 05/23/13  5:55 PM.  Always use your most recent  med list.               cyclobenzaprine 10 MG tablet  Commonly known as:  FLEXERIL  Take 1 tablet (10 mg total) by mouth at bedtime as needed for muscle spasms.     doxycycline 100 MG tablet  Commonly known as:  VIBRA-TABS  Take 20 mg by mouth daily.     FINACEA 15 % cream  Generic drug:  Azelaic Acid     predniSONE 10 MG tablet  Commonly known as:  DELTASONE  4 tablets x 2 days, 3 tabs x 2 days, 2 tabs x 2 days, 1 tab x 2 days     venlafaxine XR 75 MG 24 hr capsule  Commonly known as:  EFFEXOR-XR  Take 75 mg by mouth daily with breakfast.           Objective:   Physical Exam BP 128/72  Pulse 68  Temp(Src) 98.2 F (36.8 C)  Ht 5\' 3"  (1.6 m)  Wt 147 lb (66.679 kg)  BMI 26.05 kg/m2  SpO2 100%  General -- alert, well-developed, NAD.  Neck --no thyromegaly ;  FROM, no TTP HEENT-- Not pale.   Lungs -- normal respiratory effort, no intercostal retractions, no accessory muscle use, and normal breath sounds.  Heart-- normal rate, regular rhythm, no murmur.  Abdomen-- Not distended, good bowel sounds,soft, non-tender. Extremities-- no pretibial edema bilaterally  Neurologic--  alert & oriented X3. Speech normal, gait normal, strength normal in all extremities. DTRs symmetric ; pinprick exam UE normal Psych-- Cognition and judgment appear intact. Cooperative with normal attention span and concentration. No anxious or depressed appearing.      Assessment & Plan:

## 2013-05-23 NOTE — Assessment & Plan Note (Addendum)
Neck pain as described in the history of present illness started ~ 3 weeks ago, on chart review she has a h/o neck pain. No radiculopathy per se. Plan: Prednisone, see instructions

## 2013-05-23 NOTE — Patient Instructions (Signed)
Get your blood work before you leave   Next visit is for a physical exam in 1 year, fasting Please make an appointment    Prednisone as prescribed Tylenol  500 mg OTC 2 tabs a day every 8 hours as needed for pain Flexeril at bedtime as needed Call if not improving in the next 2 or 3 weeks

## 2013-05-23 NOTE — Progress Notes (Signed)
Pre visit review using our clinic review tool, if applicable. No additional management support is needed unless otherwise documented below in the visit note. 

## 2013-05-24 LAB — VITAMIN D 25 HYDROXY (VIT D DEFICIENCY, FRACTURES): VIT D 25 HYDROXY: 25 ng/mL — AB (ref 30–89)

## 2013-05-27 ENCOUNTER — Encounter: Payer: Self-pay | Admitting: *Deleted

## 2013-05-27 MED ORDER — VITAMIN D (ERGOCALCIFEROL) 1.25 MG (50000 UNIT) PO CAPS: 50000.0000 [IU] | ORAL_CAPSULE | ORAL | Status: DC

## 2013-05-27 NOTE — Addendum Note (Signed)
Addended by: Peggyann Shoals on: 05/27/2013 02:39 PM   Modules accepted: Orders

## 2013-08-17 ENCOUNTER — Other Ambulatory Visit: Payer: Self-pay | Admitting: Internal Medicine

## 2013-08-19 ENCOUNTER — Telehealth: Payer: Self-pay | Admitting: *Deleted

## 2013-08-19 NOTE — Telephone Encounter (Signed)
After she finished high dose of vitamin D weekly, she needs to take OTC vitamin D 1000 units daily. We're planning to check her vitamin D next year however if she likes to check sooner hat is ok (vitamin D, dx vit d def)

## 2013-08-19 NOTE — Telephone Encounter (Signed)
rx request - Drisdol 50,000 Last OV- 05/23/13  Lab results -05/25/13 was told to take rx for 3 months .

## 2013-08-20 NOTE — Telephone Encounter (Signed)
Pt notified. Will check next year and take OTC vit D 1000 units daily

## 2013-09-17 ENCOUNTER — Encounter: Payer: Self-pay | Admitting: Internal Medicine

## 2013-11-26 ENCOUNTER — Ambulatory Visit (AMBULATORY_SURGERY_CENTER): Payer: Self-pay | Admitting: *Deleted

## 2013-11-26 VITALS — Ht 63.0 in | Wt 143.2 lb

## 2013-11-26 DIAGNOSIS — Z1211 Encounter for screening for malignant neoplasm of colon: Secondary | ICD-10-CM

## 2013-11-26 MED ORDER — MOVIPREP 100 G PO SOLR
1.0000 | Freq: Once | ORAL | Status: DC
Start: 2013-11-26 — End: 2013-12-09

## 2013-11-26 NOTE — Progress Notes (Signed)
No home 02 use. ewm No egg or soy allergy. ewm No home cpap use. ewm Some nausea with past sedation but no severe issues. ewm Pt declined emmi video. ewm

## 2013-12-06 ENCOUNTER — Encounter: Payer: Self-pay | Admitting: Internal Medicine

## 2013-12-09 ENCOUNTER — Encounter: Payer: Self-pay | Admitting: Internal Medicine

## 2013-12-09 ENCOUNTER — Ambulatory Visit (AMBULATORY_SURGERY_CENTER): Payer: BC Managed Care – PPO | Admitting: Internal Medicine

## 2013-12-09 ENCOUNTER — Telehealth: Payer: Self-pay | Admitting: Internal Medicine

## 2013-12-09 VITALS — BP 121/74 | HR 64 | Temp 98.3°F | Resp 12 | Ht 63.0 in | Wt 143.0 lb

## 2013-12-09 DIAGNOSIS — Z1211 Encounter for screening for malignant neoplasm of colon: Secondary | ICD-10-CM

## 2013-12-09 DIAGNOSIS — D122 Benign neoplasm of ascending colon: Secondary | ICD-10-CM

## 2013-12-09 DIAGNOSIS — D126 Benign neoplasm of colon, unspecified: Secondary | ICD-10-CM

## 2013-12-09 MED ORDER — SODIUM CHLORIDE 0.9 % IV SOLN: 500.0000 mL | INTRAVENOUS | Status: DC

## 2013-12-09 NOTE — Op Note (Signed)
[place]  [address] [place], [id number]   COLONOSCOPY PROCEDURE REPORT  PATIENT: Audrey, Wright  MR#: [id number] BIRTHDATE: [birth year] , 48  yrs. old GENDER: female ENDOSCOPIST: Jerene Bears, MD REFERRED VX:BLTJ Larose Kells, M.D. PROCEDURE DATE:  12/09/2013 PROCEDURE:   Colonoscopy with snare polypectomy First Screening Colonoscopy - Avg.  risk and is 50 yrs.  old or older Yes.  Prior Negative Screening - Now for repeat screening. N/A  History of Adenoma - Now for follow-up colonoscopy & has been > or = to 3 yrs.  N/A  Polyps Removed Today? Yes. ASA CLASS:   Class II INDICATIONS:average risk for colorectal cancer.   1st colonoscopy MEDICATIONS: Monitored anesthesia care and Propofol (Diprivan) 280 mg IV  DESCRIPTION OF PROCEDURE:   After the risks benefits and alternatives of the procedure were thoroughly explained, informed consent was obtained. Digital exam  revealed no rectal mass.   The LB [id number]-H190 [id number]  endoscope was introduced through the anus and advanced to the cecum, which was identified by both the appendix and ileocecal valve. No adverse events experienced.   The quality of the prep was good, using MoviPrep  The instrument was then slowly withdrawn as the colon was fully examined.  COLON FINDINGS: A sessile polyp measuring 5 mm in size was found in the ascending colon.  A polypectomy was performed with a cold snare.  The resection was complete, the polyp tissue was completely retrieved and sent to histology.   There was mild diverticulosis noted in the descending colon.  Retroflexed views revealed internal Grade I hemorrhoids. The time to cecum=7 minutes 11 seconds. Withdrawal time=9 minutes 21 seconds.  The scope was withdrawn and the procedure completed. COMPLICATIONS: There were no complications.  ENDOSCOPIC IMPRESSION: 1.   Sessile polyp measuring 5 mm in size was found in the ascending colon; polypectomy was performed with a cold snare 2.    Mild diverticulosis was noted in the descending colon  RECOMMENDATIONS: 1.  Await pathology results 2.  High fiber diet 3.  If the polyp removed today is proven to be an adenomatous (pre-cancerous) polyp, you will need a repeat colonoscopy in 5 years.  Otherwise you should continue to follow colorectal cancer screening guidelines for "routine risk" patients with colonoscopy in 10 years.  You will receive a letter within 1-2 weeks with the results of your biopsy as well as final recommendations.  Please call my office if you have not received a letter after 3 weeks.  eSigned:  Jerene Bears, MD 12/09/2013 11:43 AM      cc: The Patient and Kathlene November, MD

## 2013-12-09 NOTE — Progress Notes (Signed)
No problems noted in the recovery room. maw 

## 2013-12-09 NOTE — Progress Notes (Signed)
Report to PACU, RN, vss, BBS= Clear.  

## 2013-12-09 NOTE — Patient Instructions (Signed)
YOU HAD AN ENDOSCOPIC PROCEDURE TODAY AT THE [facility]: Refer to the procedure report that was given to you for any specific questions about what was found during the examination.  If the procedure report does not answer your questions, please call your gastroenterologist to clarify.  If you requested that your care partner not be given the details of your procedure findings, then the procedure report has been included in a sealed envelope for you to review at your convenience later.  YOU SHOULD EXPECT: Some feelings of bloating in the abdomen. Passage of more gas than usual.  Walking can help get rid of the air that was put into your GI tract during the procedure and reduce the bloating. If you had a lower endoscopy (such as a colonoscopy or flexible sigmoidoscopy) you may notice spotting of blood in your stool or on the toilet paper. If you underwent a bowel prep for your procedure, then you may not have a normal bowel movement for a few days.  DIET: Your first meal following the procedure should be a light meal and then it is ok to progress to your normal diet.  A half-sandwich or bowl of soup is an example of a good first meal.  Heavy or fried foods are harder to digest and may make you feel nauseous or bloated.  Likewise meals heavy in dairy and vegetables can cause extra gas to form and this can also increase the bloating.  Drink plenty of fluids but you should avoid alcoholic beverages for 24 hours.  ACTIVITY: Your care partner should take you home directly after the procedure.  You should plan to take it easy, moving slowly for the rest of the day.  You can resume normal activity the day after the procedure however you should NOT DRIVE or use heavy machinery for 24 hours (because of the sedation medicines used during the test).    SYMPTOMS TO REPORT IMMEDIATELY: A gastroenterologist can be reached at any hour.  During normal business hours, 8:30 AM to 5:00 PM Monday through Friday,  call [phone].  After hours and on weekends, please call the GI answering service at [phone] who will take a message and have the physician on call contact you.   Following lower endoscopy (colonoscopy or flexible sigmoidoscopy):  Excessive amounts of blood in the stool  Significant tenderness or worsening of abdominal pains  Swelling of the abdomen that is new, acute  Fever of 100F or higher    FOLLOW UP: If any biopsies were taken you will be contacted by phone or by letter within the next 1-3 weeks.  Call your gastroenterologist if you have not heard about the biopsies in 3 weeks.  Our staff will call the home number listed on your records the next business day following your procedure to check on you and address any questions or concerns that you may have at that time regarding the information given to you following your procedure. This is a courtesy call and so if there is no answer at the home number and we have not heard from you through the emergency physician on call, we will assume that you have returned to your regular daily activities without incident.  SIGNATURES/CONFIDENTIALITY: You and/or your care partner have signed paperwork which will be entered into your electronic medical record.  These signatures attest to the fact that that the information above on your After Visit Summary has been reviewed and is understood.  Full responsibility of the confidentiality   of this discharge information lies with you and/or your care-partner.     

## 2013-12-09 NOTE — Telephone Encounter (Signed)
Returned patient's call and she was concerned that she couldn't get but 3/4 of the second prep down this morning.  She states that her stools are clear and I advised her that it was okay.  She will come on in at scheduled time.

## 2013-12-09 NOTE — Progress Notes (Signed)
Called to room to assist during endoscopic procedure.  Patient ID and intended procedure confirmed with present staff. Received instructions for my participation in the procedure from the performing physician.  

## 2013-12-10 ENCOUNTER — Telehealth: Payer: Self-pay

## 2013-12-10 NOTE — Telephone Encounter (Signed)
Left message on answering machine. 

## 2013-12-12 ENCOUNTER — Encounter: Payer: Self-pay | Admitting: Internal Medicine

## 2014-03-21 DIAGNOSIS — D2362 Other benign neoplasm of skin of left upper limb, including shoulder: Secondary | ICD-10-CM

## 2014-03-21 HISTORY — DX: Other benign neoplasm of skin of left upper limb, including shoulder: D23.62

## 2014-05-21 ENCOUNTER — Other Ambulatory Visit: Payer: Self-pay

## 2014-05-21 DIAGNOSIS — Z1231 Encounter for screening mammogram for malignant neoplasm of breast: Secondary | ICD-10-CM

## 2014-05-28 ENCOUNTER — Encounter (INDEPENDENT_AMBULATORY_CARE_PROVIDER_SITE_OTHER): Payer: Self-pay

## 2014-05-28 ENCOUNTER — Ambulatory Visit
Admission: RE | Admit: 2014-05-28 | Discharge: 2014-05-28 | Disposition: A | Discharge status: Home / Self Care | Payer: BLUE CROSS/BLUE SHIELD | Source: Ambulatory Visit

## 2014-05-28 DIAGNOSIS — Z1231 Encounter for screening mammogram for malignant neoplasm of breast: Secondary | ICD-10-CM

## 2014-06-23 ENCOUNTER — Other Ambulatory Visit: Payer: Self-pay | Admitting: Obstetrics and Gynecology

## 2014-06-24 LAB — CYTOLOGY - PAP

## 2015-04-21 ENCOUNTER — Other Ambulatory Visit: Payer: Self-pay

## 2015-04-21 DIAGNOSIS — Z1231 Encounter for screening mammogram for malignant neoplasm of breast: Secondary | ICD-10-CM

## 2015-06-01 ENCOUNTER — Ambulatory Visit: Payer: BLUE CROSS/BLUE SHIELD

## 2015-06-12 ENCOUNTER — Ambulatory Visit
Admission: RE | Admit: 2015-06-12 | Discharge: 2015-06-12 | Disposition: A | Discharge status: Home / Self Care | Payer: BLUE CROSS/BLUE SHIELD | Source: Ambulatory Visit

## 2015-06-12 DIAGNOSIS — Z1231 Encounter for screening mammogram for malignant neoplasm of breast: Secondary | ICD-10-CM

## 2016-04-29 ENCOUNTER — Other Ambulatory Visit: Payer: Self-pay | Admitting: Obstetrics and Gynecology

## 2016-04-29 DIAGNOSIS — Z1231 Encounter for screening mammogram for malignant neoplasm of breast: Secondary | ICD-10-CM

## 2016-04-29 DIAGNOSIS — Z9882 Breast implant status: Secondary | ICD-10-CM

## 2016-05-11 ENCOUNTER — Ambulatory Visit (INDEPENDENT_AMBULATORY_CARE_PROVIDER_SITE_OTHER): Payer: BLUE CROSS/BLUE SHIELD | Admitting: Internal Medicine

## 2016-05-11 ENCOUNTER — Encounter: Payer: Self-pay | Admitting: Internal Medicine

## 2016-05-11 VITALS — BP 122/76 | HR 71 | Temp 98.1°F | Resp 12 | Ht 63.0 in | Wt 152.1 lb

## 2016-05-11 DIAGNOSIS — Z0000 Encounter for general adult medical examination without abnormal findings: Secondary | ICD-10-CM

## 2016-05-11 DIAGNOSIS — Z114 Encounter for screening for human immunodeficiency virus [HIV]: Secondary | ICD-10-CM

## 2016-05-11 DIAGNOSIS — Z1159 Encounter for screening for other viral diseases: Secondary | ICD-10-CM | POA: Diagnosis not present

## 2016-05-11 DIAGNOSIS — H9193 Unspecified hearing loss, bilateral: Secondary | ICD-10-CM

## 2016-05-11 NOTE — Assessment & Plan Note (Addendum)
Tdap--2015 Female care per gyn - to see Dr Gaetano Net [phone]  Bone Density--wnl per pt    cscope 2015, 5 years   Diet-exercise discussed  Labs: CMP, FLP, CBC, TSH, vitamin D, HIV, hep C

## 2016-05-11 NOTE — Progress Notes (Signed)
Subjective:    Patient ID: Audrey Wright, female    DOB: 19-Dec-1965, 51 y.o.   MRN: [id number]  DOS:  05/11/2016 Type of visit - description : CPX Interval history: several concerns   Review of Systems Saw a optometrist, they found some "hardening of the arteries" when they check her eyes, no visual disturbances. Request a hearing test, last line family history of HOH, reports problems bilaterally. Last weekend, she was watching her son playing soccer, was hit with up all. No LOC, nausea or vomiting. The next day she was hurting at the area of the impact -left face and sinus area but no headache per se. Now is asymptomatic. Reports when she does certain   sports like Zumba, she has some mild knee pain bilaterally and the knees  feel "overheated". No actual swelling. Occasional pain at the left fifth finger, PIP. Otherwise ROS is negative   Constitutional: No fever. No chills. No unexplained wt changes. No unusual sweats  HEENT: No dental problems, no ear discharge, no facial swelling, no voice changes. No eye discharge, no eye  redness , no  intolerance to light   Respiratory: No wheezing , no  difficulty breathing. No cough , no mucus production  Cardiovascular: No CP, no leg swelling , no  Palpitations  GI: no nausea, no vomiting, no diarrhea , no  abdominal pain.  No blood in the stools. No dysphagia, no odynophagia    Endocrine: No polyphagia, no polyuria , no polydipsia  GU: No dysuria, gross hematuria, difficulty urinating. No urinary urgency, no frequency.  Musculoskeletal: see above Skin: No change in the color of the skin, palor , no  Rash  Allergic, immunologic: No environmental allergies , no  food allergies  Neurological: see above  Hematological: No enlarged lymph nodes, no easy bruising , no unusual bleedings  Psychiatry: No suicidal ideas, no hallucinations, no beavior problems, no confusion.  No unusual/severe anxiety, no depression    Past Medical  History:  Diagnosis Date  . Anxiety and depression   . Dysplastic nevus of left upper extremity 2016   w/ moderate to severe melanocytic aptypia, left upper arm  . Rosacea     Past Surgical History:  Procedure Laterality Date  . LAPAROSCOPY     pelvic-- prior to 2000  . PLACEMENT OF BREAST IMPLANTS Right 1987  . VAGINAL HYSTERECTOMY  2004   no oophorectomy    Social History   Social History  . Marital status: Married    Spouse name: N/A  . Number of children: 2  . Years of education: N/A   Occupational History  . 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 Smoking status: Never Smoker  . Smokeless tobacco: Never Used  . Alcohol use Yes     Comment: social wine   . Drug use: No  . Sexual activity: Not on file   Other Topics Concern  . Not on file   Social History Narrative  . No narrative on file     Family History  Problem Relation Age of Onset  . Hypertension Mother   . Hyperlipidemia Mother   . Squamous cell carcinoma Mother   . Breast cancer Maternal Grandmother     postmenopause breast  . Pancreatic cancer Maternal Grandfather   . Colon cancer Neg Hx   . CAD Neg Hx   . Stroke Neg Hx   . Rectal cancer Neg Hx   . Stomach  cancer Neg Hx   . Esophageal cancer Neg Hx      Allergies as of 05/11/2016      Reactions   Cephalosporins Hives   Penicillins Hives      Medication List       Accurate as of 05/11/16 11:59 PM. Always use your most recent med list.          CENTRUM SILVER PO Take by mouth.   cholecalciferol 1000 units tablet Commonly known as:  VITAMIN D Take 1,000 Units by mouth daily.   doxycycline 100 MG tablet Commonly known as:  VIBRA-TABS Take 20 mg by mouth daily.   FINACEA 15 % cream Generic drug:  Azelaic Acid   venlafaxine XR 150 MG 24 hr capsule Commonly known as:  EFFEXOR-XR Take 150 mg by mouth daily.          Objective:   Physical Exam BP 122/76 (BP Location: Left Arm, Patient  Position: Sitting, Cuff Size: Normal)   Pulse 71   Temp 98.1 F (36.7 C) (Oral)   Resp 12   Ht 5\' 3"  (1.6 m)   Wt 152 lb 2 oz (69 kg)   SpO2 98%   BMI 26.95 kg/m   General:   Well developed, well nourished . NAD.  Neck: No  thyromegaly  HEENT:  Normocephalic . Face symmetric, atraumatic Lungs:  CTA B Normal respiratory effort, no intercostal retractions, no accessory muscle use. Heart: RRR,  no murmur.  No pretibial edema bilaterally  Abdomen:  Not distended, soft, non-tender. No rebound or rigidity.   Skin: Exposed areas without rash. Not pale. Not jaundice MSK: Hands and wrists without synovitis, mild joint enlargement consistent with DJD.  Knees without swelling, synovitis, range of motion normal, joints are stable. Neurologic:  alert & oriented X3.  Speech normal, gait appropriate for age and unassisted Strength symmetric and appropriate for age. EOMI, face symmetric, pupils equal and reactive Psych: Cognition and judgment appear intact.  Cooperative with normal attention span and concentration.  Behavior appropriate. No anxious or depressed appearing.    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 Head injury: see ROS, neuro exam normal, she did not have a headache rather pain at the site of impact. Recommend observation Vit d def--- Status post ergocalciferol few months ago, currently on daily vitamins. Checking labs HOH: Refer to an audiologist   RTC 1 year

## 2016-05-11 NOTE — Progress Notes (Signed)
Pre visit review using our clinic review tool, if applicable. No additional management support is needed unless otherwise documented below in the visit note. 

## 2016-05-11 NOTE — Patient Instructions (Signed)
GO TO THE LAB : Get the blood work     GO TO THE FRONT DESK Schedule your next appointment for a physical exam in one year   If you need more information about a healthy diet,  visit: The American Heart Association, http://www.heart.org    Consider calorie counting,   MYFITNESSPAL  ??

## 2016-05-12 DIAGNOSIS — Z09 Encounter for follow-up examination after completed treatment for conditions other than malignant neoplasm: Secondary | ICD-10-CM | POA: Insufficient documentation

## 2016-05-12 LAB — CBC WITH DIFFERENTIAL/PLATELET
BASOS PCT: 0.6 % (ref 0.0–3.0)
Basophils Absolute: 0 10*3/uL (ref 0.0–0.1)
EOS ABS: 0.1 10*3/uL (ref 0.0–0.7)
Eosinophils Relative: 1.4 % (ref 0.0–5.0)
HEMATOCRIT: 42.8 % (ref 36.0–46.0)
Hemoglobin: 14.5 g/dL (ref 12.0–15.0)
LYMPHS ABS: 1.7 10*3/uL (ref 0.7–4.0)
Lymphocytes Relative: 28.3 % (ref 12.0–46.0)
MCHC: 33.9 g/dL (ref 30.0–36.0)
MCV: 90.2 fl (ref 78.0–100.0)
Monocytes Absolute: 0.4 10*3/uL (ref 0.1–1.0)
Monocytes Relative: 6.7 % (ref 3.0–12.0)
NEUTROS ABS: 3.8 10*3/uL (ref 1.4–7.7)
NEUTROS PCT: 63 % (ref 43.0–77.0)
PLATELETS: 315 10*3/uL (ref 150.0–400.0)
RBC: 4.75 Mil/uL (ref 3.87–5.11)
RDW: 12.4 % (ref 11.5–15.5)
WBC: 6.1 10*3/uL (ref 4.0–10.5)

## 2016-05-12 LAB — COMPREHENSIVE METABOLIC PANEL
ALBUMIN: 4.5 g/dL (ref 3.5–5.2)
ALT: 22 U/L (ref 0–35)
AST: 20 U/L (ref 0–37)
Alkaline Phosphatase: 92 U/L (ref 39–117)
BUN: 21 mg/dL (ref 6–23)
CHLORIDE: 104 meq/L (ref 96–112)
CO2: 30 meq/L (ref 19–32)
CREATININE: 0.77 mg/dL (ref 0.40–1.20)
Calcium: 9.1 mg/dL (ref 8.4–10.5)
GFR: 83.48 mL/min (ref >60.00)
Glucose, Bld: 83 mg/dL (ref 70–99)
POTASSIUM: 3.8 meq/L (ref 3.5–5.1)
Sodium: 139 mEq/L (ref 135–145)
Total Bilirubin: 0.4 mg/dL (ref 0.2–1.2)
Total Protein: 7.1 g/dL (ref 6.0–8.3)

## 2016-05-12 LAB — LIPID PANEL
CHOL/HDL RATIO: 3
CHOLESTEROL: 216 mg/dL — AB (ref 0–200)
HDL: 66.8 mg/dL (ref >39.00)
LDL CALC: 137 mg/dL — AB (ref 0–99)
NonHDL: 148.89
Triglycerides: 61 mg/dL (ref 0.0–149.0)
VLDL: 12.2 mg/dL (ref 0.0–40.0)

## 2016-05-12 LAB — TSH: TSH: 2.03 u[IU]/mL (ref 0.35–4.50)

## 2016-05-12 LAB — HEPATITIS C ANTIBODY: HCV Ab: NEGATIVE

## 2016-05-12 LAB — HIV ANTIBODY (ROUTINE TESTING W REFLEX): HIV 1&2 Ab, 4th Generation: NONREACTIVE

## 2016-05-12 NOTE — Assessment & Plan Note (Signed)
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 Head injury: see ROS, neuro exam normal, she did not have a headache rather pain at the site of impact. Recommend observation Vit d def--- Status post ergocalciferol few months ago, currently on daily vitamins. Checking labs HOH: Refer to an audiologist   RTC 1 year

## 2016-05-13 LAB — VITAMIN D 1,25 DIHYDROXY
Vitamin D 1, 25 (OH)2 Total: 44 pg/mL (ref 18–72)
Vitamin D2 1, 25 (OH)2: <8 pg/mL
Vitamin D3 1, 25 (OH)2: 44 pg/mL

## 2016-05-24 DIAGNOSIS — F4323 Adjustment disorder with mixed anxiety and depressed mood: Secondary | ICD-10-CM | POA: Diagnosis not present

## 2016-05-31 DIAGNOSIS — F4323 Adjustment disorder with mixed anxiety and depressed mood: Secondary | ICD-10-CM | POA: Diagnosis not present

## 2016-06-13 ENCOUNTER — Ambulatory Visit
Admission: RE | Admit: 2016-06-13 | Discharge: 2016-06-13 | Disposition: A | Discharge status: Home / Self Care | Payer: BLUE CROSS/BLUE SHIELD | Source: Ambulatory Visit | Attending: Obstetrics and Gynecology | Admitting: Obstetrics and Gynecology

## 2016-06-13 DIAGNOSIS — Z1231 Encounter for screening mammogram for malignant neoplasm of breast: Secondary | ICD-10-CM | POA: Diagnosis not present

## 2016-06-13 DIAGNOSIS — Z9882 Breast implant status: Secondary | ICD-10-CM

## 2016-06-14 DIAGNOSIS — F4323 Adjustment disorder with mixed anxiety and depressed mood: Secondary | ICD-10-CM | POA: Diagnosis not present

## 2016-06-21 DIAGNOSIS — F4323 Adjustment disorder with mixed anxiety and depressed mood: Secondary | ICD-10-CM | POA: Diagnosis not present

## 2016-06-27 DIAGNOSIS — Z6826 Body mass index (BMI) 26.0-26.9, adult: Secondary | ICD-10-CM | POA: Diagnosis not present

## 2016-06-27 DIAGNOSIS — Z01419 Encounter for gynecological examination (general) (routine) without abnormal findings: Secondary | ICD-10-CM | POA: Diagnosis not present

## 2016-06-28 DIAGNOSIS — F4323 Adjustment disorder with mixed anxiety and depressed mood: Secondary | ICD-10-CM | POA: Diagnosis not present

## 2016-07-08 DIAGNOSIS — F4323 Adjustment disorder with mixed anxiety and depressed mood: Secondary | ICD-10-CM | POA: Diagnosis not present

## 2016-07-19 DIAGNOSIS — F4323 Adjustment disorder with mixed anxiety and depressed mood: Secondary | ICD-10-CM | POA: Diagnosis not present

## 2016-07-25 ENCOUNTER — Ambulatory Visit: Payer: BLUE CROSS/BLUE SHIELD | Attending: Internal Medicine | Admitting: Audiology

## 2016-07-25 DIAGNOSIS — Z0110 Encounter for examination of ears and hearing without abnormal findings: Secondary | ICD-10-CM | POA: Diagnosis present

## 2016-07-25 DIAGNOSIS — H93299 Other abnormal auditory perceptions, unspecified ear: Secondary | ICD-10-CM | POA: Diagnosis present

## 2016-07-25 DIAGNOSIS — H9041 Sensorineural hearing loss, unilateral, right ear, with unrestricted hearing on the contralateral side: Secondary | ICD-10-CM | POA: Insufficient documentation

## 2016-07-25 NOTE — Procedures (Signed)
[facility]  [place], [place] [address]  [phone]   Audiological Evaluation  Patient Name: Audrey Wright   Status: Outpatient   DOB: Oct 15, 1965    Diagnosis: Bilateral Hearing Loss MRN: [id number] Date:  07/25/2016     Referent: Colon Branch, MD  History: Audrey Wright was seen for an audiological evaluation. Primary Concern: Hearing, gradual Pain: None History of hearing problems: Y for the past 3-4 years. History of ear infections:   N History of dizziness/vertigo:  N History of balance issues:  N Tinnitus: Y - occational high pitched.  Sound sensitivity: N History of occupational noise exposure: N History of hypertension: N History of diabetes:  N Family history of hearing loss:  Materal grandfather and mother (late 66's) have hearing loss. Other concerns: Menopause    Evaluation: Conventional pure tone audiometry from 250Hz  - 8000Hz  with using insert earphones.  Hearing Thresholds: Right ear:  Thresholds of 30 dBHL at 250Hz  and 20-25 dBHL from 500hz  - 8000Hz . The hearing loss appears sensorineural.  Left ear:    Thresholds of 15-20 dBHL Reliability is good Speech reception levels (repeating words near threshold) using recorded spondee word lists:  Right ear: 25 dBHL.  Left ear:  15 dBHL Word recognition (at comfortably loud volumes) using recorded word lists at 45 dBHL, in quiet.  Right ear: 92%.  Left ear:   100% Word recognition in minimal background noise:  +5 dBHL  Right ear: 68%                              Left ear:  74%  Tympanometry (middle ear function) with ipsilateral acoustic reflexes.  Right ear: Normal (Type A) with present acoustic reflex at 1000Hz .  Left ear: Normal (Type A) with present acoustic reflex at 1000Hz .  CONCLUSION:      Audrey Wright has slightly poorer word recognition on the right side with a borderline mild low frequency hearing loss and a slight hearing loss throughout the  rest of the speech range. The left ear is borderline normal. Both ears appear to have a sensorineural component.  Middle ear function is within normal limits bilaterally. Word recognition is excellent, but slightly reduced compared to the left ear at 92% in the right ear and is excellent at 100% in the left ear in the left ear in quiet at conversational speech levels. In minimal background noise, word recognition drops to poor in the right ear and fair in the left ear.  Difficulty hearing in most social situations is expected. Auditory processing screening tests were administered: Competing Sentences and Dichotic Digits.  Although Audrey Wright scored 100%, within normal limits bilaterally, she struggled and had numerous delays and self-corrections indicating significant difficulty hearing in complex listening environments (areas with competing noise or poor acoustics such as in the kitchen).   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 In addition it is important that hearing strategies and hearing conservation strategies be implemented - summarized below. The test results were discussed and Audrey Wright counseled.   RECOMMENDATIONS: 1.   Monitor hearing closely with a repeat audiological evaluation in 12 months (earlier if there is any change in hearing or ear pressure).  This appointment has been scheduled Jul 25, 2017 at 1pm here at [facility] outpatient [facility]. Please call to cancel or change the appointment at [phone]. 2.   Strategies that help improve hearing include: A) Face the speaker directly. Optimal is having the speakers face well - lit.  Unless amplified, being within 3-6 feet of the speaker will enhance word recognition. B) Avoid having the speaker back-lit as this  will minimize the ability to use cues from lip-reading, facial expression and gestures. C)  Word recognition is poorer in background noise. For optimal word recognition, turn off the TV, radio or noisy fan when engaging in conversation. In a restaurant, try to sit away from noise sources and close to the primary speaker.  D)  Ask for topic clarification from time to time in order to remain in the conversation.  Most people don't mind repeating or clarifying a point when asked.  If needed, explain the difficulty hearing in background noise or hearing loss. 3.   To protect hearing use hearing protection during noisy activities such as using a weed eater, moving the lawn, shooting, etc.    Musician's plugs, are available from [employer] for music related hearing protection because there is no distortion.  Other hearing protection, such as sponge plugs (available at pharmacies) or earmuffs (available at sporting goods stores or department stores such as Paediatric nurse) are useful for noisy activities and venues. 4.   Although tinnitus is currently intermittent, if there is a change, notify your physician and call for an earlier hearing evaluation. Strategies to minimize the bothersome effects of tinnitus include: 1) avoid quiet  2) use noise maskers at home such as a sound machine, quiet music, a fan or other background noise at a volume just loud enough to mask the high pitched tinnitus. 3) If the tinnitus becomes more bothersome, adversely affecting your sleep or concentration, contact your physician,  seek additional medical help by an ENT for further treatment of your tinnitus.  Gradyn Shein L. Heide Spark Au.D., CCC-A Doctor of Audiology 07/25/2016  cc: Colon Branch, MD

## 2016-07-28 DIAGNOSIS — F4323 Adjustment disorder with mixed anxiety and depressed mood: Secondary | ICD-10-CM | POA: Diagnosis not present

## 2016-08-02 DIAGNOSIS — F4323 Adjustment disorder with mixed anxiety and depressed mood: Secondary | ICD-10-CM | POA: Diagnosis not present

## 2016-08-09 DIAGNOSIS — F4323 Adjustment disorder with mixed anxiety and depressed mood: Secondary | ICD-10-CM | POA: Diagnosis not present

## 2016-08-19 DIAGNOSIS — F4323 Adjustment disorder with mixed anxiety and depressed mood: Secondary | ICD-10-CM | POA: Diagnosis not present

## 2016-08-23 DIAGNOSIS — F4323 Adjustment disorder with mixed anxiety and depressed mood: Secondary | ICD-10-CM | POA: Diagnosis not present

## 2016-09-09 DIAGNOSIS — F4323 Adjustment disorder with mixed anxiety and depressed mood: Secondary | ICD-10-CM | POA: Diagnosis not present

## 2016-09-13 DIAGNOSIS — F4323 Adjustment disorder with mixed anxiety and depressed mood: Secondary | ICD-10-CM | POA: Diagnosis not present

## 2016-09-20 DIAGNOSIS — F4323 Adjustment disorder with mixed anxiety and depressed mood: Secondary | ICD-10-CM | POA: Diagnosis not present

## 2016-09-27 DIAGNOSIS — F4323 Adjustment disorder with mixed anxiety and depressed mood: Secondary | ICD-10-CM | POA: Diagnosis not present

## 2016-10-10 DIAGNOSIS — F4323 Adjustment disorder with mixed anxiety and depressed mood: Secondary | ICD-10-CM | POA: Diagnosis not present

## 2016-10-24 DIAGNOSIS — F4323 Adjustment disorder with mixed anxiety and depressed mood: Secondary | ICD-10-CM | POA: Diagnosis not present

## 2016-11-07 DIAGNOSIS — F4323 Adjustment disorder with mixed anxiety and depressed mood: Secondary | ICD-10-CM | POA: Diagnosis not present

## 2016-11-21 DIAGNOSIS — F4323 Adjustment disorder with mixed anxiety and depressed mood: Secondary | ICD-10-CM | POA: Diagnosis not present

## 2016-12-07 DIAGNOSIS — F4323 Adjustment disorder with mixed anxiety and depressed mood: Secondary | ICD-10-CM | POA: Diagnosis not present

## 2017-01-05 DIAGNOSIS — F4323 Adjustment disorder with mixed anxiety and depressed mood: Secondary | ICD-10-CM | POA: Diagnosis not present

## 2017-01-18 DIAGNOSIS — D485 Neoplasm of uncertain behavior of skin: Secondary | ICD-10-CM | POA: Diagnosis not present

## 2017-01-18 DIAGNOSIS — D225 Melanocytic nevi of trunk: Secondary | ICD-10-CM | POA: Diagnosis not present

## 2017-01-18 DIAGNOSIS — L821 Other seborrheic keratosis: Secondary | ICD-10-CM | POA: Diagnosis not present

## 2017-01-18 DIAGNOSIS — Z808 Family history of malignant neoplasm of other organs or systems: Secondary | ICD-10-CM | POA: Diagnosis not present

## 2017-01-18 DIAGNOSIS — D2371 Other benign neoplasm of skin of right lower limb, including hip: Secondary | ICD-10-CM | POA: Diagnosis not present

## 2017-01-18 DIAGNOSIS — D2230 Melanocytic nevi of unspecified part of face: Secondary | ICD-10-CM | POA: Diagnosis not present

## 2017-03-02 DIAGNOSIS — F4323 Adjustment disorder with mixed anxiety and depressed mood: Secondary | ICD-10-CM | POA: Diagnosis not present

## 2017-03-28 DIAGNOSIS — F4323 Adjustment disorder with mixed anxiety and depressed mood: Secondary | ICD-10-CM | POA: Diagnosis not present

## 2017-04-05 ENCOUNTER — Other Ambulatory Visit: Payer: Self-pay | Admitting: Obstetrics and Gynecology

## 2017-04-05 DIAGNOSIS — Z1231 Encounter for screening mammogram for malignant neoplasm of breast: Secondary | ICD-10-CM

## 2017-06-15 ENCOUNTER — Ambulatory Visit
Admission: RE | Admit: 2017-06-15 | Discharge: 2017-06-15 | Disposition: A | Discharge status: Home / Self Care | Payer: BLUE CROSS/BLUE SHIELD | Source: Ambulatory Visit | Attending: Obstetrics and Gynecology | Admitting: Obstetrics and Gynecology

## 2017-06-15 DIAGNOSIS — Z1231 Encounter for screening mammogram for malignant neoplasm of breast: Secondary | ICD-10-CM | POA: Diagnosis not present

## 2017-07-04 ENCOUNTER — Ambulatory Visit: Payer: Self-pay | Admitting: Audiology

## 2017-07-05 DIAGNOSIS — Z01419 Encounter for gynecological examination (general) (routine) without abnormal findings: Secondary | ICD-10-CM | POA: Diagnosis not present

## 2017-07-05 DIAGNOSIS — E559 Vitamin D deficiency, unspecified: Secondary | ICD-10-CM | POA: Diagnosis not present

## 2017-07-05 DIAGNOSIS — Z6827 Body mass index (BMI) 27.0-27.9, adult: Secondary | ICD-10-CM | POA: Diagnosis not present

## 2017-07-25 ENCOUNTER — Ambulatory Visit: Payer: Self-pay | Admitting: Audiology

## 2017-11-24 DIAGNOSIS — L720 Epidermal cyst: Secondary | ICD-10-CM | POA: Diagnosis not present

## 2017-11-24 DIAGNOSIS — L719 Rosacea, unspecified: Secondary | ICD-10-CM | POA: Diagnosis not present

## 2017-11-24 DIAGNOSIS — L723 Sebaceous cyst: Secondary | ICD-10-CM | POA: Diagnosis not present

## 2018-01-24 DIAGNOSIS — D2371 Other benign neoplasm of skin of right lower limb, including hip: Secondary | ICD-10-CM | POA: Diagnosis not present

## 2018-01-24 DIAGNOSIS — L910 Hypertrophic scar: Secondary | ICD-10-CM | POA: Diagnosis not present

## 2018-01-24 DIAGNOSIS — Z808 Family history of malignant neoplasm of other organs or systems: Secondary | ICD-10-CM | POA: Diagnosis not present

## 2018-01-24 DIAGNOSIS — D225 Melanocytic nevi of trunk: Secondary | ICD-10-CM | POA: Diagnosis not present

## 2018-01-24 DIAGNOSIS — Z411 Encounter for cosmetic surgery: Secondary | ICD-10-CM | POA: Diagnosis not present

## 2018-05-03 ENCOUNTER — Other Ambulatory Visit: Payer: Self-pay | Admitting: Obstetrics and Gynecology

## 2018-05-03 DIAGNOSIS — Z1231 Encounter for screening mammogram for malignant neoplasm of breast: Secondary | ICD-10-CM

## 2018-07-06 ENCOUNTER — Ambulatory Visit: Payer: BLUE CROSS/BLUE SHIELD

## 2018-08-09 DIAGNOSIS — Z803 Family history of malignant neoplasm of breast: Secondary | ICD-10-CM | POA: Diagnosis not present

## 2018-08-09 DIAGNOSIS — Z01419 Encounter for gynecological examination (general) (routine) without abnormal findings: Secondary | ICD-10-CM | POA: Diagnosis not present

## 2018-08-09 DIAGNOSIS — Z6826 Body mass index (BMI) 26.0-26.9, adult: Secondary | ICD-10-CM | POA: Diagnosis not present

## 2018-08-09 DIAGNOSIS — Z808 Family history of malignant neoplasm of other organs or systems: Secondary | ICD-10-CM | POA: Diagnosis not present

## 2018-08-09 DIAGNOSIS — Z86010 Personal history of colonic polyps: Secondary | ICD-10-CM | POA: Diagnosis not present

## 2018-08-20 ENCOUNTER — Other Ambulatory Visit: Payer: Self-pay

## 2018-08-20 ENCOUNTER — Ambulatory Visit
Admission: RE | Admit: 2018-08-20 | Discharge: 2018-08-20 | Disposition: A | Discharge status: Home / Self Care | Payer: BLUE CROSS/BLUE SHIELD | Source: Ambulatory Visit | Attending: Obstetrics and Gynecology | Admitting: Obstetrics and Gynecology

## 2018-08-20 DIAGNOSIS — Z1231 Encounter for screening mammogram for malignant neoplasm of breast: Secondary | ICD-10-CM

## 2018-09-19 DIAGNOSIS — Z809 Family history of malignant neoplasm, unspecified: Secondary | ICD-10-CM | POA: Diagnosis not present

## 2018-10-19 ENCOUNTER — Encounter: Payer: Self-pay | Admitting: Internal Medicine

## 2018-12-07 ENCOUNTER — Encounter: Payer: Self-pay | Admitting: Internal Medicine

## 2018-12-11 ENCOUNTER — Encounter: Payer: Self-pay | Admitting: Internal Medicine

## 2019-01-03 ENCOUNTER — Other Ambulatory Visit: Payer: Self-pay

## 2019-01-03 ENCOUNTER — Ambulatory Visit (AMBULATORY_SURGERY_CENTER): Payer: Self-pay | Admitting: *Deleted

## 2019-01-03 VITALS — Temp 97.3°F | Ht 63.0 in | Wt 149.0 lb

## 2019-01-03 DIAGNOSIS — Z86010 Personal history of colonic polyps: Secondary | ICD-10-CM

## 2019-01-03 MED ORDER — SUPREP BOWEL PREP KIT 17.5-3.13-1.6 GM/177ML PO SOLN: 1.0000 | Freq: Once | ORAL | 0 refills | Status: AC

## 2019-01-03 NOTE — Progress Notes (Signed)
No egg or soy allergy known to patient  No issues with past sedation with any surgeries  or procedures, no intubation problems  No diet pills per patient No home 02 use per patient  No blood thinners per patient  Pt states  issues with constipation - uses Miralax as needed- not chronic, just occ- normally soft regular stools  No A fib or A flutter  EMMI video sent to pt's e mail  Suprep $15 coupon   Due to the COVID-19 pandemic we are asking patients to follow these guidelines. Please only bring one care partner. Please be aware that your care partner may wait in the car in the parking lot or if they feel like they will be too hot to wait in the car, they may wait in the lobby on the 4th floor. All care partners are required to wear a mask the entire time (we do not have any that we can provide them), they need to practice social distancing, and we will do a Covid check for all patient's and care partners when you arrive. Also we will check their temperature and your temperature. If the care partner waits in their car they need to stay in the parking lot the entire time and we will call them on their cell phone when the patient is ready for discharge so they can bring the car to the front of the building. Also all patient's will need to wear a mask into building.

## 2019-01-04 ENCOUNTER — Encounter: Payer: Self-pay | Admitting: Internal Medicine

## 2019-01-16 ENCOUNTER — Telehealth: Payer: Self-pay

## 2019-01-16 NOTE — Telephone Encounter (Signed)
Patient called back and answered "NO" to all screening questions. °

## 2019-01-16 NOTE — Telephone Encounter (Signed)
Covid-19 screening questions   Do you now or have you had a fever in the last 14 days?  Do you have any respiratory symptoms of shortness of breath or cough now or in the last 14 days?  Do you have any family members or close contacts with diagnosed or suspected Covid-19 in the past 14 days?  Have you been tested for Covid-19 and found to be positive?       

## 2019-01-17 ENCOUNTER — Other Ambulatory Visit: Payer: Self-pay

## 2019-01-17 ENCOUNTER — Other Ambulatory Visit: Payer: Self-pay | Admitting: Internal Medicine

## 2019-01-17 ENCOUNTER — Encounter: Payer: Self-pay | Admitting: Internal Medicine

## 2019-01-17 ENCOUNTER — Ambulatory Visit (AMBULATORY_SURGERY_CENTER): Payer: BC Managed Care – PPO | Admitting: Internal Medicine

## 2019-01-17 VITALS — BP 130/72 | HR 77 | Temp 98.7°F | Resp 12 | Ht 63.0 in | Wt 149.0 lb

## 2019-01-17 DIAGNOSIS — Z86010 Personal history of colonic polyps: Secondary | ICD-10-CM | POA: Diagnosis not present

## 2019-01-17 DIAGNOSIS — D122 Benign neoplasm of ascending colon: Secondary | ICD-10-CM

## 2019-01-17 DIAGNOSIS — Z1211 Encounter for screening for malignant neoplasm of colon: Secondary | ICD-10-CM | POA: Diagnosis not present

## 2019-01-17 MED ORDER — SODIUM CHLORIDE 0.9 % IV SOLN: 500.0000 mL | Freq: Once | INTRAVENOUS | Status: DC

## 2019-01-17 NOTE — Progress Notes (Signed)
Called to room to assist during endoscopic procedure.  Patient ID and intended procedure confirmed with present staff. Received instructions for my participation in the procedure from the performing physician.  

## 2019-01-17 NOTE — Progress Notes (Signed)
Report to PACU, RN, vss, BBS= Clear.  

## 2019-01-17 NOTE — Patient Instructions (Signed)
Please read handouts provided. Continue present medications. Await pathology results.        YOU HAD AN ENDOSCOPIC PROCEDURE TODAY AT THE [facility]:   Refer to the procedure report that was given to you for any specific questions about what was found during the examination.  If the procedure report does not answer your questions, please call your gastroenterologist to clarify.  If you requested that your care partner not be given the details of your procedure findings, then the procedure report has been included in a sealed envelope for you to review at your convenience later.  YOU SHOULD EXPECT: Some feelings of bloating in the abdomen. Passage of more gas than usual.  Walking can help get rid of the air that was put into your GI tract during the procedure and reduce the bloating. If you had a lower endoscopy (such as a colonoscopy or flexible sigmoidoscopy) you may notice spotting of blood in your stool or on the toilet paper. If you underwent a bowel prep for your procedure, you may not have a normal bowel movement for a few days.  Please Note:  You might notice some irritation and congestion in your nose or some drainage.  This is from the oxygen used during your procedure.  There is no need for concern and it should clear up in a day or so.  SYMPTOMS TO REPORT IMMEDIATELY:   Following lower endoscopy (colonoscopy or flexible sigmoidoscopy):  Excessive amounts of blood in the stool  Significant tenderness or worsening of abdominal pains  Swelling of the abdomen that is new, acute  Fever of 100F or higher    For urgent or emergent issues, a gastroenterologist can be reached at any hour by calling [phone].   DIET:  We do recommend a small meal at first, but then you may proceed to your regular diet.  Drink plenty of fluids but you should avoid alcoholic beverages for 24 hours.  ACTIVITY:  You should plan to take it easy for the rest of today and you should NOT  DRIVE or use heavy machinery until tomorrow (because of the sedation medicines used during the test).    FOLLOW UP: Our staff will call the number listed on your records 48-72 hours following your procedure to check on you and address any questions or concerns that you may have regarding the information given to you following your procedure. If we do not reach you, we will leave a message.  We will attempt to reach you two times.  During this call, we will ask if you have developed any symptoms of COVID 19. If you develop any symptoms (ie: fever, flu-like symptoms, shortness of breath, cough etc.) before then, please call [phone].  If you test positive for Covid 19 in the 2 weeks post procedure, please call and report this information to us.    If any biopsies were taken you will be contacted by phone or by letter within the next 1-3 weeks.  Please call us at [phone] if you have not heard about the biopsies in 3 weeks.    SIGNATURES/CONFIDENTIALITY: You and/or your care partner have signed paperwork which will be entered into your electronic medical record.  These signatures attest to the fact that that the information above on your After Visit Summary has been reviewed and is understood.  Full responsibility of the confidentiality of this discharge information lies with you and/or your care-partner. 

## 2019-01-17 NOTE — Progress Notes (Signed)
VS- [facility] Temperature- Audrey Wright  Pt's states no medical or surgical changes since previsit or office visit.

## 2019-01-17 NOTE — Op Note (Signed)
[facility] Patient Name: Audrey Wright Procedure Date: 01/17/2019 1:59 PM MRN: [id number] Endoscopist: Jerene Bears , MD Age: 53 Referring MD:  Date of Birth: [birth year] Gender: Female Account #: [id number] Procedure:                Colonoscopy Indications:              High risk colon cancer surveillance: Personal                            history of non-advanced adenoma, Last colonoscopy 5                            years ago Medicines:                Monitored Anesthesia Care Procedure:                Pre-Anesthesia Assessment:                           - Prior to the procedure, a History and Physical                            was performed, and patient medications and                            allergies were reviewed. The patient's tolerance of                            previous anesthesia was also reviewed. The risks                            and benefits of the procedure and the sedation                            options and risks were discussed with the patient.                            All questions were answered, and informed consent                            was obtained. Prior Anticoagulants: The patient has                            taken no previous anticoagulant or antiplatelet                            agents. ASA Grade Assessment: II - A patient with                            mild systemic disease. After reviewing the risks                            and benefits, the patient was deemed in  satisfactory condition to undergo the procedure.                           After obtaining informed consent, the colonoscope                            was passed under direct vision. Throughout the                            procedure, the patient's blood pressure, pulse, and                            oxygen saturations were monitored continuously. The                            Colonoscope was introduced through the anus and                        advanced to the cecum, identified by appendiceal                            orifice and ileocecal valve. The colonoscopy was                            performed without difficulty. The patient tolerated                            the procedure well. The quality of the bowel                            preparation was good. The ileocecal valve,                            appendiceal orifice, and rectum were photographed. Scope In: 2:02:39 PM Scope Out: 2:19:37 PM Scope Withdrawal Time: 0 hours 11 minutes 3 seconds  Total Procedure Duration: 0 hours 16 minutes 58 seconds  Findings:                 The perianal and digital rectal examinations were                            normal.                           A 3 mm polyp was found in the ascending colon. The                            polyp was sessile. The polyp was removed with a                            cold snare. Resection and retrieval were complete.                           A few small-mouthed diverticula were found in the  descending colon.                           The exam was otherwise without abnormality on                            direct and retroflexion views. Complications:            No immediate complications. Estimated Blood Loss:     Estimated blood loss: none. Impression:               - One 3 mm polyp in the ascending colon, removed                            with a cold snare. Resected and retrieved.                           - Diverticulosis in the descending colon.                           - The examination was otherwise normal on direct                            and retroflexion views. Recommendation:           - Patient has a contact number available for                            emergencies. The signs and symptoms of potential                            delayed complications were discussed with the                            patient. Return to normal activities  tomorrow.                            Written discharge instructions were provided to the                            patient.                           - Resume previous diet.                           - Continue present medications.                           - Await pathology results.                           - Repeat colonoscopy is recommended for                            surveillance. The colonoscopy date will be  determined after pathology results from today's                            exam become available for review. Jerene Bears, MD 01/17/2019 2:24:33 PM This report has been signed electronically.

## 2019-01-21 ENCOUNTER — Telehealth: Payer: Self-pay | Admitting: *Deleted

## 2019-01-21 NOTE — Telephone Encounter (Signed)
  Follow up Call-  Call back number 01/17/2019  Post procedure Call Back phone  # [phone]  Permission to leave phone message Yes  Some recent data might be hidden     Patient questions:  Do you have a fever, pain , or abdominal swelling? No. Pain Score  0 *  Have you tolerated food without any problems? Yes.    Have you been able to return to your normal activities? Yes.    Do you have any questions about your discharge instructions: Diet   No. Medications  No. Follow up visit  No.  Do you have questions or concerns about your Care? No.  Actions: * If pain score is 4 or above: No action needed, pain <4.  1. Have you developed a fever since your procedure? no  2.   Have you had an respiratory symptoms (SOB or cough) since your procedure? no  3.   Have you tested positive for COVID 19 since your procedure no  4.   Have you had any family members/close contacts diagnosed with the COVID 19 since your procedure?  no   If yes to any of these questions please route to Joylene John, RN and Alphonsa Gin, Therapist, sports.

## 2019-01-21 NOTE — Telephone Encounter (Signed)
Left message on f/u call 

## 2019-01-23 ENCOUNTER — Encounter: Payer: Self-pay | Admitting: Internal Medicine

## 2019-02-06 DIAGNOSIS — Z808 Family history of malignant neoplasm of other organs or systems: Secondary | ICD-10-CM | POA: Diagnosis not present

## 2019-02-06 DIAGNOSIS — Z23 Encounter for immunization: Secondary | ICD-10-CM | POA: Diagnosis not present

## 2019-02-06 DIAGNOSIS — D2271 Melanocytic nevi of right lower limb, including hip: Secondary | ICD-10-CM | POA: Diagnosis not present

## 2019-02-06 DIAGNOSIS — L719 Rosacea, unspecified: Secondary | ICD-10-CM | POA: Diagnosis not present

## 2019-02-06 DIAGNOSIS — L821 Other seborrheic keratosis: Secondary | ICD-10-CM | POA: Diagnosis not present

## 2019-04-09 DIAGNOSIS — Z20828 Contact with and (suspected) exposure to other viral communicable diseases: Secondary | ICD-10-CM | POA: Diagnosis not present

## 2019-07-19 ENCOUNTER — Other Ambulatory Visit: Payer: Self-pay | Admitting: Obstetrics and Gynecology

## 2019-07-19 DIAGNOSIS — Z1231 Encounter for screening mammogram for malignant neoplasm of breast: Secondary | ICD-10-CM

## 2019-07-26 DIAGNOSIS — M79672 Pain in left foot: Secondary | ICD-10-CM | POA: Diagnosis not present

## 2019-08-12 DIAGNOSIS — F329 Major depressive disorder, single episode, unspecified: Secondary | ICD-10-CM | POA: Diagnosis not present

## 2019-08-12 DIAGNOSIS — N952 Postmenopausal atrophic vaginitis: Secondary | ICD-10-CM | POA: Diagnosis not present

## 2019-08-12 DIAGNOSIS — Z01419 Encounter for gynecological examination (general) (routine) without abnormal findings: Secondary | ICD-10-CM | POA: Diagnosis not present

## 2019-08-12 DIAGNOSIS — Z6826 Body mass index (BMI) 26.0-26.9, adult: Secondary | ICD-10-CM | POA: Diagnosis not present

## 2019-08-12 DIAGNOSIS — Z90710 Acquired absence of both cervix and uterus: Secondary | ICD-10-CM | POA: Insufficient documentation

## 2019-08-15 ENCOUNTER — Telehealth: Payer: Self-pay

## 2019-08-15 NOTE — Telephone Encounter (Signed)
Patient called in to advise that she had to be seen by a virtual video with [facility] on line with [employer] per the patient she gave [employer] the fax number to fax over the after summery visit to Dr. Larose Kells at [phone] Patient would like the nurse or Dr. Larose Kells to give her a call about coming in to the office to get a strap throat test and to look in her right ear because she seems to think that she has an ear infection. Per the patient she is not running a temp. Please call the patient back at [phone] as soon as possible.

## 2019-08-16 ENCOUNTER — Telehealth: Payer: BC Managed Care – PPO | Admitting: Internal Medicine

## 2019-08-16 NOTE — Telephone Encounter (Signed)
LMOM informing Pt that we are not currently doing strep testing in office. Instructed if that is what she feels like she needs or is requesting- recommend to be seen at local urgent care. Instructed to call office if questions/concerns.

## 2019-08-21 ENCOUNTER — Other Ambulatory Visit: Payer: Self-pay

## 2019-08-21 ENCOUNTER — Ambulatory Visit
Admission: RE | Admit: 2019-08-21 | Discharge: 2019-08-21 | Disposition: A | Discharge status: Home / Self Care | Payer: BC Managed Care – PPO | Source: Ambulatory Visit | Attending: Obstetrics and Gynecology | Admitting: Obstetrics and Gynecology

## 2019-08-21 DIAGNOSIS — Z1231 Encounter for screening mammogram for malignant neoplasm of breast: Secondary | ICD-10-CM

## 2019-12-21 DIAGNOSIS — Z20822 Contact with and (suspected) exposure to covid-19: Secondary | ICD-10-CM | POA: Diagnosis not present

## 2020-01-07 ENCOUNTER — Telehealth: Payer: Self-pay | Admitting: Internal Medicine

## 2020-01-07 ENCOUNTER — Other Ambulatory Visit: Payer: Self-pay

## 2020-01-07 NOTE — Telephone Encounter (Signed)
Patient called back to answer prescreening questions and stated that she just came back from [place] on 10/18 and had a negative antigen test on 10/17. Is it ok for patient to come in for her new patient appointment or does she need to reschedule, please advise. CB is [phone]

## 2020-01-07 NOTE — Telephone Encounter (Signed)
Spoke with Dr Ethelene Hal, to advise if okay to come in for an appt after traveling outside of the country, per Ethelene Hal it is ok.  Called patient back and advised that it would be ok to keep the appt 10/20 @ 1:00 pm. No further questions.  Dm/cma

## 2020-01-08 ENCOUNTER — Ambulatory Visit (INDEPENDENT_AMBULATORY_CARE_PROVIDER_SITE_OTHER): Payer: BC Managed Care – PPO | Admitting: Family Medicine

## 2020-01-08 ENCOUNTER — Encounter: Payer: Self-pay | Admitting: Family Medicine

## 2020-01-08 VITALS — BP 126/80 | HR 75 | Temp 97.0°F | Ht 62.5 in | Wt 151.0 lb

## 2020-01-08 DIAGNOSIS — E559 Vitamin D deficiency, unspecified: Secondary | ICD-10-CM

## 2020-01-08 DIAGNOSIS — F419 Anxiety disorder, unspecified: Secondary | ICD-10-CM | POA: Diagnosis not present

## 2020-01-08 DIAGNOSIS — Z0000 Encounter for general adult medical examination without abnormal findings: Secondary | ICD-10-CM | POA: Diagnosis not present

## 2020-01-08 DIAGNOSIS — Z23 Encounter for immunization: Secondary | ICD-10-CM | POA: Diagnosis not present

## 2020-01-08 DIAGNOSIS — Z13220 Encounter for screening for lipoid disorders: Secondary | ICD-10-CM | POA: Diagnosis not present

## 2020-01-08 LAB — CBC
HCT: 41.7 % (ref 36.0–46.0)
Hemoglobin: 14 g/dL (ref 12.0–15.0)
MCHC: 33.6 g/dL (ref 30.0–36.0)
MCV: 89.5 fl (ref 78.0–100.0)
Platelets: 314 10*3/uL (ref 150.0–400.0)
RBC: 4.65 Mil/uL (ref 3.87–5.11)
RDW: 13.3 % (ref 11.5–15.5)
WBC: 6.9 10*3/uL (ref 4.0–10.5)

## 2020-01-08 LAB — LIPID PANEL
Cholesterol: 242 mg/dL — ABNORMAL HIGH (ref 0–200)
HDL: 79.6 mg/dL (ref >39.00)
LDL Cholesterol: 141 mg/dL — ABNORMAL HIGH (ref 0–99)
NonHDL: 162.47
Total CHOL/HDL Ratio: 3
Triglycerides: 106 mg/dL (ref 0.0–149.0)
VLDL: 21.2 mg/dL (ref 0.0–40.0)

## 2020-01-08 LAB — COMPREHENSIVE METABOLIC PANEL
ALT: 25 U/L (ref 0–35)
AST: 23 U/L (ref 0–37)
Albumin: 4.3 g/dL (ref 3.5–5.2)
Alkaline Phosphatase: 103 U/L (ref 39–117)
BUN: 17 mg/dL (ref 6–23)
CO2: 29 mEq/L (ref 19–32)
Calcium: 9.2 mg/dL (ref 8.4–10.5)
Chloride: 104 mEq/L (ref 96–112)
Creatinine, Ser: 0.78 mg/dL (ref 0.40–1.20)
GFR: 84.84 mL/min (ref >60.00)
Glucose, Bld: 79 mg/dL (ref 70–99)
Potassium: 4.4 mEq/L (ref 3.5–5.1)
Sodium: 139 mEq/L (ref 135–145)
Total Bilirubin: 0.5 mg/dL (ref 0.2–1.2)
Total Protein: 6.5 g/dL (ref 6.0–8.3)

## 2020-01-08 LAB — VITAMIN D 25 HYDROXY (VIT D DEFICIENCY, FRACTURES): VITD: 26.87 ng/mL — ABNORMAL LOW (ref 30.00–100.00)

## 2020-01-08 NOTE — Progress Notes (Signed)
Audrey Wright is a 54 y.o. female  Chief Complaint  Patient presents with  . Establish Care    NP- CPE/labs.  fasting this afternoon. c/o having elevated BP,      HPI: Audrey Wright is a 54 y.o. female is here to establish care with our office and for annual CPE, fasing labs. Her previous PCP was Dr. Larose Kells at [place].  She has had flu and covid vaccines.   Pt notes elevated BP - SPB around 140 - at last GYN visit and at time of covid test to come back from [place] to [place].  Last PAP: s/p hysterectomy - Dr. Gaetano Net Last mammo: 08/2019 Last colonoscopy: 12/2018 - f/u in 7 years (12/2025) - Dr. Hilarie Fredrickson with [facility]  Diet/Exercise: overall healthy, working to decrease portion size; walking, elliptical on regular basis  Dental: UTD   Vision: UTD  Med refills needed today? no   Past Medical History:  Diagnosis Date  . Anxiety and depression   . Dysplastic nevus of left upper extremity 2016   w/ moderate to severe melanocytic aptypia, left upper arm  . Rosacea     Past Surgical History:  Procedure Laterality Date  . AUGMENTATION MAMMAPLASTY Right 1982  . BREAST CYST ASPIRATION  2012  . COLONOSCOPY    . LAPAROSCOPY     pelvic-- prior to 2000  . PLACEMENT OF BREAST IMPLANTS Right 1987  . POLYPECTOMY  2015   TA X 1  . VAGINAL HYSTERECTOMY  2004   no oophorectomy    Social History   Socioeconomic History  . Marital status: Married    Spouse name: Not on file  . Number of children: 2  . Years of education: Not on file  . Highest education level: Not on file  Occupational History  . 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 Smoking status: Never Smoker  . Smokeless tobacco: Never Used  Vaping Use  . Vaping Use: Never used  Substance and Sexual Activity  . Alcohol use: Yes    Comment: social wine   . Drug use: No  . Sexual activity: Yes  Other Topics Concern  . Not on file  Social History Narrative  . Not on file    Social Determinants of Health   Financial Resource Strain:   . Difficulty of Paying Living Expenses: Not on file  Food Insecurity:   . Worried About Charity fundraiser in the Last Year: Not on file  . Ran Out of Food in the Last Year: Not on file  Transportation Needs:   . Lack of Transportation (Medical): Not on file  . Lack of Transportation (Non-Medical): Not on file  Physical Activity:   . Days of Exercise per Week: Not on file  . Minutes of Exercise per Session: Not on file  Stress:   . Feeling of Stress : Not on file  Social Connections:   . Frequency of Communication with Friends and Family: Not on file  . Frequency of Social Gatherings with Friends and Family: Not on file  . Attends Religious Services: Not on file  . Active Member of Clubs or Organizations: Not on file  . Attends Archivist Meetings: Not on file  . Marital Status: Not on file  Intimate Partner Violence:   . Fear of Current or Ex-Partner: Not on file  . Emotionally Abused: Not on file  . Physically Abused: Not on  file  . Sexually Abused: Not on file    Family History  Problem Relation Age of Onset  . Hypertension Mother   . Hyperlipidemia Mother   . Squamous cell carcinoma Mother   . Breast cancer Maternal Grandmother        postmenopause breast  . Pancreatic cancer Maternal Grandfather   . Colon cancer Neg Hx   . CAD Neg Hx   . Stroke Neg Hx   . Rectal cancer Neg Hx   . Stomach cancer Neg Hx   . Esophageal cancer Neg Hx   . Colon polyps Neg Hx      Immunization History  Administered Date(s) Administered  . Influenza Whole 02/02/2010  . Influenza,inj,quad, With Preservative 01/11/2018  . Influenza-Unspecified 01/22/2016, 01/14/2017, 12/11/2019  . PFIZER SARS-COV-2 Vaccination 05/17/2019, 06/07/2019  . Td 01/14/2002  . Tdap 05/23/2013    Outpatient Encounter Medications as of 01/08/2020  Medication Sig Note  . cholecalciferol (VITAMIN D) 1000 units tablet Take 1,000 Units  by mouth daily.   Marland Kitchen doxycycline (VIBRA-TABS) 100 MG tablet Take 20 mg by mouth daily.  05/23/2013: 10 mg   . FINACEA 15 % cream    . Multiple Vitamins-Minerals (CENTRUM SILVER PO) Take by mouth.   . venlafaxine XR (EFFEXOR-XR) 150 MG 24 hr capsule Take 150 mg by mouth daily.   . Vitamin D, Ergocalciferol, (DRISDOL) 1.25 MG (50000 UT) CAPS capsule ergocalciferol (vitamin D2) 1,250 mcg (50,000 unit) capsule (Patient not taking: Reported on 01/08/2020)    No facility-administered encounter medications on file as of 01/08/2020.     ROS: Gen: no fever, chills  Skin: no rash, itching ENT: no ear pain, ear drainage, nasal congestion, rhinorrhea, sinus pressure, sore throat Eyes: no blurry vision, double vision Resp: no cough, wheeze,SOB Breast: no breast tenderness, no nipple discharge, no breast masses CV: no CP, palpitations, LE edema,  GI: no heartburn, n/v/d/c, abd pain GU: no dysuria, urgency, frequency, hematuria MSK: no joint pain, myalgias, back pain Neuro: no dizziness, headache, weakness, vertigo Psych: no depression, anxiety, insomnia   Allergies  Allergen Reactions  . Cephalosporins Hives  . Penicillins Hives    BP 126/80   Pulse 75   Temp (!) 97 F (36.1 C) (Temporal)   Ht 5' 2.5" (1.588 m)   Wt 151 lb (68.5 kg)   SpO2 99%   BMI 27.18 kg/m    BP Readings from Last 3 Encounters:  01/08/20 126/80  01/17/19 130/72  05/11/16 122/76   Pulse Readings from Last 3 Encounters:  01/08/20 75  01/17/19 77  05/11/16 71   Wt Readings from Last 3 Encounters:  01/08/20 151 lb (68.5 kg)  01/17/19 149 lb (67.6 kg)  01/03/19 149 lb (67.6 kg)   Physical Exam Constitutional:      General: She is not in acute distress.    Appearance: She is well-developed.  HENT:     Head: Normocephalic and atraumatic.     Right Ear: Tympanic membrane and ear canal normal.     Left Ear: Tympanic membrane and ear canal normal.     Nose: Nose normal.  Eyes:     Conjunctiva/sclera:  Conjunctivae normal.     Pupils: Pupils are equal, round, and reactive to light.  Neck:     Thyroid: No thyromegaly.  Cardiovascular:     Rate and Rhythm: Normal rate and regular rhythm.     Heart sounds: Normal heart sounds. No murmur heard.   Pulmonary:     Effort: Pulmonary  effort is normal. No respiratory distress.     Breath sounds: Normal breath sounds. No wheezing or rhonchi.  Abdominal:     General: Bowel sounds are normal. There is no distension.     Palpations: Abdomen is soft. There is no mass.     Tenderness: There is no abdominal tenderness.  Musculoskeletal:     Cervical back: Neck supple.     Right lower leg: No edema.     Left lower leg: No edema.  Lymphadenopathy:     Cervical: No cervical adenopathy.  Skin:    General: Skin is warm and dry.  Neurological:     Mental Status: She is alert and oriented to person, place, and time.     Motor: No abnormal muscle tone.     Coordination: Coordination normal.  Psychiatric:        Behavior: Behavior normal.     A/P:  1. Annual physical exam - discussed importance of regular CV exercise, healthy diet, adequate sleep - UTD on dental and vision exams - UTD on colonoscopy, mammo; follows with GYN Dr. Gaetano Net - immunizations UTD, Shingrix today - CBC - Comprehensive metabolic panel - Lipid panel - next CPE in 1 year  2. Anxiety - stable, controlled on effexor  3. Vitamin D deficiency - VITAMIN D 25 Hydroxy (Vit-D Deficiency, Fractures)  4. Screening for lipid disorders - Lipid panel  5. Need for shingles vaccine - Varicella-zoster vaccine IM (Shingrix) - RTO for RN visit in 2-59mo for #2   This visit occurred during the SARS-CoV-2 public health emergency.  Safety protocols were in place, including screening questions prior to the visit, additional usage of staff PPE, and extensive cleaning of exam room while observing appropriate contact time as indicated for disinfecting solutions.

## 2020-01-09 ENCOUNTER — Encounter: Payer: Self-pay | Admitting: Family Medicine

## 2020-02-27 DIAGNOSIS — Z800 Family history of malignant neoplasm of digestive organs: Secondary | ICD-10-CM | POA: Diagnosis not present

## 2020-02-27 DIAGNOSIS — D2371 Other benign neoplasm of skin of right lower limb, including hip: Secondary | ICD-10-CM | POA: Diagnosis not present

## 2020-02-27 DIAGNOSIS — D225 Melanocytic nevi of trunk: Secondary | ICD-10-CM | POA: Diagnosis not present

## 2020-03-25 DIAGNOSIS — M79672 Pain in left foot: Secondary | ICD-10-CM | POA: Diagnosis not present

## 2020-05-11 ENCOUNTER — Other Ambulatory Visit: Payer: Self-pay

## 2020-05-12 ENCOUNTER — Ambulatory Visit (INDEPENDENT_AMBULATORY_CARE_PROVIDER_SITE_OTHER): Payer: BC Managed Care – PPO

## 2020-05-12 DIAGNOSIS — Z23 Encounter for immunization: Secondary | ICD-10-CM | POA: Diagnosis not present

## 2020-05-12 NOTE — Patient Instructions (Signed)
Health Maintenance Due  Topic Date Due  . COVID-19 Vaccine (3 - Booster for Pfizer series) 12/08/2019    Depression screen PHQ 2/9 01/08/2020 05/11/2016  Decreased Interest 0 0  Down, Depressed, Hopeless 0 0  PHQ - 2 Score 0 0

## 2020-05-12 NOTE — Progress Notes (Signed)
Per orders of Dr. Loletha Grayer pt is here for 2nd dose of shingles vaccine, pt states previous dose left arm red at injection site, pt denies any fever swelling or soreness after 1st dose.

## 2020-07-16 ENCOUNTER — Other Ambulatory Visit: Payer: Self-pay | Admitting: Obstetrics and Gynecology

## 2020-07-16 DIAGNOSIS — Z1231 Encounter for screening mammogram for malignant neoplasm of breast: Secondary | ICD-10-CM

## 2020-08-13 DIAGNOSIS — Z6826 Body mass index (BMI) 26.0-26.9, adult: Secondary | ICD-10-CM | POA: Diagnosis not present

## 2020-08-13 DIAGNOSIS — Z13820 Encounter for screening for osteoporosis: Secondary | ICD-10-CM | POA: Diagnosis not present

## 2020-08-13 DIAGNOSIS — Z01419 Encounter for gynecological examination (general) (routine) without abnormal findings: Secondary | ICD-10-CM | POA: Diagnosis not present

## 2020-09-07 ENCOUNTER — Ambulatory Visit
Admission: RE | Admit: 2020-09-07 | Discharge: 2020-09-07 | Disposition: A | Discharge status: Home / Self Care | Payer: BC Managed Care – PPO | Source: Ambulatory Visit | Attending: Obstetrics and Gynecology | Admitting: Obstetrics and Gynecology

## 2020-09-07 ENCOUNTER — Other Ambulatory Visit: Payer: Self-pay

## 2020-09-07 DIAGNOSIS — Z1231 Encounter for screening mammogram for malignant neoplasm of breast: Secondary | ICD-10-CM | POA: Diagnosis not present

## 2021-02-26 DIAGNOSIS — B078 Other viral warts: Secondary | ICD-10-CM | POA: Diagnosis not present

## 2021-02-26 DIAGNOSIS — D2371 Other benign neoplasm of skin of right lower limb, including hip: Secondary | ICD-10-CM | POA: Diagnosis not present

## 2021-02-26 DIAGNOSIS — L578 Other skin changes due to chronic exposure to nonionizing radiation: Secondary | ICD-10-CM | POA: Diagnosis not present

## 2021-02-26 DIAGNOSIS — D2271 Melanocytic nevi of right lower limb, including hip: Secondary | ICD-10-CM | POA: Diagnosis not present

## 2021-02-26 DIAGNOSIS — L219 Seborrheic dermatitis, unspecified: Secondary | ICD-10-CM | POA: Diagnosis not present

## 2021-04-20 ENCOUNTER — Encounter: Payer: Self-pay | Admitting: Nurse Practitioner

## 2021-04-20 ENCOUNTER — Ambulatory Visit (INDEPENDENT_AMBULATORY_CARE_PROVIDER_SITE_OTHER): Payer: BC Managed Care – PPO | Admitting: Nurse Practitioner

## 2021-04-20 ENCOUNTER — Other Ambulatory Visit: Payer: Self-pay

## 2021-04-20 VITALS — BP 110/62 | HR 78 | Temp 97.5°F | Ht 62.75 in | Wt 154.4 lb

## 2021-04-20 DIAGNOSIS — Z136 Encounter for screening for cardiovascular disorders: Secondary | ICD-10-CM

## 2021-04-20 DIAGNOSIS — Z800 Family history of malignant neoplasm of digestive organs: Secondary | ICD-10-CM | POA: Insufficient documentation

## 2021-04-20 DIAGNOSIS — Z808 Family history of malignant neoplasm of other organs or systems: Secondary | ICD-10-CM | POA: Insufficient documentation

## 2021-04-20 DIAGNOSIS — E559 Vitamin D deficiency, unspecified: Secondary | ICD-10-CM

## 2021-04-20 DIAGNOSIS — Z0000 Encounter for general adult medical examination without abnormal findings: Secondary | ICD-10-CM | POA: Diagnosis not present

## 2021-04-20 DIAGNOSIS — Z13220 Encounter for screening for lipoid disorders: Secondary | ICD-10-CM | POA: Diagnosis not present

## 2021-04-20 LAB — LIPID PANEL
Cholesterol: 216 mg/dL — ABNORMAL HIGH (ref 0–200)
HDL: 67 mg/dL (ref >39.00)
LDL Cholesterol: 128 mg/dL — ABNORMAL HIGH (ref 0–99)
NonHDL: 148.59
Total CHOL/HDL Ratio: 3
Triglycerides: 101 mg/dL (ref 0.0–149.0)
VLDL: 20.2 mg/dL (ref 0.0–40.0)

## 2021-04-20 LAB — CBC WITH DIFFERENTIAL/PLATELET
Basophils Absolute: 0 10*3/uL (ref 0.0–0.1)
Basophils Relative: 0.6 % (ref 0.0–3.0)
Eosinophils Absolute: 0 10*3/uL (ref 0.0–0.7)
Eosinophils Relative: 0.8 % (ref 0.0–5.0)
HCT: 42.9 % (ref 36.0–46.0)
Hemoglobin: 14.3 g/dL (ref 12.0–15.0)
Lymphocytes Relative: 28.2 % (ref 12.0–46.0)
Lymphs Abs: 1.4 10*3/uL (ref 0.7–4.0)
MCHC: 33.3 g/dL (ref 30.0–36.0)
MCV: 89.4 fl (ref 78.0–100.0)
Monocytes Absolute: 0.3 10*3/uL (ref 0.1–1.0)
Monocytes Relative: 5.9 % (ref 3.0–12.0)
Neutro Abs: 3.2 10*3/uL (ref 1.4–7.7)
Neutrophils Relative %: 64.5 % (ref 43.0–77.0)
Platelets: 298 10*3/uL (ref 150.0–400.0)
RBC: 4.8 Mil/uL (ref 3.87–5.11)
RDW: 12.9 % (ref 11.5–15.5)
WBC: 5 10*3/uL (ref 4.0–10.5)

## 2021-04-20 LAB — COMPREHENSIVE METABOLIC PANEL
ALT: 14 U/L (ref 0–35)
AST: 14 U/L (ref 0–37)
Albumin: 4.3 g/dL (ref 3.5–5.2)
Alkaline Phosphatase: 96 U/L (ref 39–117)
BUN: 20 mg/dL (ref 6–23)
CO2: 30 mEq/L (ref 19–32)
Calcium: 9.2 mg/dL (ref 8.4–10.5)
Chloride: 104 mEq/L (ref 96–112)
Creatinine, Ser: 0.8 mg/dL (ref 0.40–1.20)
GFR: 81.74 mL/min (ref >60.00)
Glucose, Bld: 100 mg/dL — ABNORMAL HIGH (ref 70–99)
Potassium: 4.5 mEq/L (ref 3.5–5.1)
Sodium: 139 mEq/L (ref 135–145)
Total Bilirubin: 0.4 mg/dL (ref 0.2–1.2)
Total Protein: 6.6 g/dL (ref 6.0–8.3)

## 2021-04-20 LAB — TSH: TSH: 1.63 u[IU]/mL (ref 0.35–5.50)

## 2021-04-20 NOTE — Progress Notes (Signed)
Subjective:    Patient ID: Audrey Wright, female    DOB: 1965-06-02, 56 y.o.   MRN: [id number]  Patient presents today for CPE   HPI  Vision:up to date Dental:up to date Diet:regular Exercise:none Weight:  Wt Readings from Last 3 Encounters:  04/20/21 154 lb 6.4 oz (70 kg)  01/08/20 151 lb (68.5 kg)  01/17/19 149 lb (67.6 kg)   Sexual History (orientation,birth control, marital status, STD):s/p hysterectomy at age 35, ovaries present. Menopause at age 33 per hormonal level. Bone denisty last done 2022 (normal  per aptient) GYN: Dr.Tomlin Up to date with breast and pelvic exam, obtain records Mammogram due 08/2021  Depression/Suicide: Depression screen [facility] 2/9 04/20/2021 01/08/2020 05/11/2016  Decreased Interest 0 0 0  Down, Depressed, Hopeless 0 0 0  PHQ - 2 Score 0 0 0  Altered sleeping 1 - -  Tired, decreased energy 0 - -  Change in appetite 1 - -  Feeling bad or failure about yourself  0 - -  Trouble concentrating 0 - -  Moving slowly or fidgety/restless 0 - -  Suicidal thoughts 0 - -  PHQ-9 Score 2 - -  Difficult doing work/chores Not difficult at all - -   Immunizations: (TDAP, Hep C screen, Pneumovax, Influenza, zoster)  Health Maintenance  Topic Date Due   Mammogram  09/07/2021   Tetanus Vaccine  05/24/2023   Colon Cancer Screening  01/16/2026   Flu Shot  Completed   COVID-19 Vaccine  Completed   Hepatitis C Screening: USPSTF Recommendation to screen - Ages 18-79 yo.  Completed   HIV Screening  Completed   Zoster (Shingles) Vaccine  Completed   HPV Vaccine  Aged Out   Fall Risk: Fall Risk  04/20/2021 01/08/2020 05/11/2016  Falls in the past year? 0 1 No  Number falls in past yr: 0 0 -  Injury with Fall? 0 0 -  Risk for fall due to : No Fall Risks - -  Follow up Falls evaluation completed - -   Medications and allergies reviewed with patient and updated if appropriate.  Patient Active Problem List   Diagnosis Date Noted   Family history of  malignant melanoma 04/20/2021   Family history of pancreatic cancer 04/20/2021   Vitamin D deficiency 01/08/2020   History of laparoscopic-assisted vaginal hysterectomy 08/12/2019   NECK PAIN, CHRONIC 06/06/2008   Anxiety 04/27/2007   FOOT PAIN, RIGHT 04/27/2007    Current Outpatient Medications on File Prior to Visit  Medication Sig Dispense Refill   doxycycline (VIBRA-TABS) 100 MG tablet Take 20 mg by mouth daily.      FINACEA 15 % cream      Multiple Vitamins-Minerals (CENTRUM SILVER PO) Take by mouth.     venlafaxine XR (EFFEXOR-XR) 150 MG 24 hr capsule Take 150 mg by mouth daily.     Vitamin D, Ergocalciferol, (DRISDOL) 1.25 MG (50000 UT) CAPS capsule ergocalciferol (vitamin D2) 1,250 mcg (50,000 unit) capsule     No current facility-administered medications on file prior to visit.    Past Medical History:  Diagnosis Date   Anxiety and depression    Dysplastic nevus of left upper extremity 2016   w/ moderate to severe melanocytic aptypia, left upper arm   Rosacea     Past Surgical History:  Procedure Laterality Date   AUGMENTATION MAMMAPLASTY Right 1982   BREAST CYST ASPIRATION  2012   COLONOSCOPY     LAPAROSCOPY     pelvic-- prior to 2000  PLACEMENT OF BREAST IMPLANTS Right 1987   POLYPECTOMY  2015   TA X 1   VAGINAL HYSTERECTOMY  2004   no oophorectomy    Social History   Socioeconomic History   Marital status: Married    Spouse name: Not on file   Number of children: 2   Years of education: Not on file   Highest education level: Not on file  Occupational History   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, 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[employer]     Employer: [employer]  Tobacco Use   Smoking status: Never   Smokeless tobacco: Never  Vaping Use   Vaping Use: Never used  Substance and Sexual Activity   Alcohol use: Yes    Comment: social wine    Drug use: No   Sexual activity: Yes  Other Topics Concern   Not on file  Social History Narrative   Not on file   Social Determinants of  Health   Financial Resource Strain: Not on file  Food Insecurity: Not on file  Transportation Needs: Not on file  Physical Activity: Not on file  Stress: Not on file  Social Connections: Not on file   Family History  Problem Relation Age of Onset   Hypertension Mother    Hyperlipidemia Mother    Squamous cell carcinoma Mother    Breast cancer Maternal Grandmother        postmenopause breast   Pancreatic cancer Maternal Grandfather    Colon cancer Neg Hx    CAD Neg Hx    Stroke Neg Hx    Rectal cancer Neg Hx    Stomach cancer Neg Hx    Esophageal cancer Neg Hx    Colon polyps Neg Hx        Review of Systems  Constitutional:  Negative for fever, malaise/fatigue and weight loss.  HENT:  Negative for congestion and sore throat.   Eyes:        Negative for visual changes  Respiratory:  Negative for cough and shortness of breath.   Cardiovascular:  Negative for chest pain, palpitations and leg swelling.  Gastrointestinal:  Negative for blood in stool, constipation, diarrhea and heartburn.  Genitourinary:  Negative for dysuria, frequency and urgency.  Musculoskeletal:  Negative for falls, joint pain and myalgias.  Skin:  Negative for rash.  Neurological:  Negative for dizziness, sensory change and headaches.  Endo/Heme/Allergies:  Does not bruise/bleed easily.  Psychiatric/Behavioral:  Negative for depression, substance abuse and suicidal ideas. The patient is not nervous/anxious.    Objective:   Vitals:   04/20/21 1026  BP: 110/62  Pulse: 78  Temp: (!) 97.5 F (36.4 C)  SpO2: 96%    Body mass index is 27.57 kg/m.   Physical Examination:  Physical Exam Constitutional:      General: She is not in acute distress. HENT:     Right Ear: Tympanic membrane, ear canal and external ear normal.     Left Ear: Tympanic membrane, ear canal and external ear normal.  Eyes:     General: No scleral icterus.    Extraocular Movements: Extraocular movements intact.      Conjunctiva/sclera: Conjunctivae normal.     Pupils: Pupils are equal, round, and reactive to light.  Cardiovascular:     Rate and Rhythm: Normal rate and regular rhythm.     Pulses: Normal pulses.     Heart sounds: Normal heart sounds.  Pulmonary:     Effort: Pulmonary effort is normal. No respiratory distress.  Breath sounds: Normal breath sounds.  Abdominal:     General: Bowel sounds are normal. There is no distension.     Palpations: Abdomen is soft.  Genitourinary:    Comments: Deferred breast and pelvic exam to GYN Musculoskeletal:        General: Normal range of motion.     Cervical back: Normal range of motion and neck supple.  Lymphadenopathy:     Cervical: No cervical adenopathy.  Skin:    General: Skin is warm and dry.  Neurological:     Mental Status: She is alert and oriented to person, place, and time.  Psychiatric:        Mood and Affect: Mood normal.        Behavior: Behavior normal.        Thought Content: Thought content normal.   ASSESSMENT and PLAN: This visit occurred during the SARS-CoV-2 public health emergency.  Safety protocols were in place, including screening questions prior to the visit, additional usage of staff PPE, and extensive cleaning of exam room while observing appropriate contact time as indicated for disinfecting solutions.   Samaia was seen today for establish care.  Diagnoses and all orders for this visit:  Preventative health care -     Comprehensive metabolic panel -     CBC with Differential/Platelet -     Lipid panel -     TSH  Encounter for lipid screening for cardiovascular disease -     Lipid panel  Vitamin D deficiency -     Vitamin D 1,25 dihydroxy  Encouraged to start daily exercise and maintain heart healthy diet. Provided printed information    Problem List Items Addressed This Visit       Other   Vitamin D deficiency   Relevant Orders   Vitamin D 1,25 dihydroxy   Other Visit Diagnoses     Preventative  health care    -  Primary   Relevant Orders   Comprehensive metabolic panel   CBC with Differential/Platelet   Lipid panel   TSH   Encounter for lipid screening for cardiovascular disease       Relevant Orders   Lipid panel       Follow up: Return in about 4 months (around 08/16/2021) for weight management (video).  Wilfred Lacy, NP

## 2021-04-20 NOTE — Patient Instructions (Signed)
Go to lab for blood draw  How to Increase Your Level of Physical Activity Getting regular physical activity is important for your overall health and well-being. Most people do not get enough exercise. There are easy ways to increase your level of physical activity, even if you have not been very active in the past or if you are just starting out. What are the benefits of physical activity? Physical activity has many short-term and long-term benefits. Being active on a regular basis can improve your physical and mental health as well as provide other benefits. Physical health benefits Helping you lose weight or maintain a healthy weight. Strengthening your muscles and bones. Reducing your risk of certain long-term (chronic) diseases, including heart disease, cancer, and diabetes. Being able to move around more easily and for longer periods of time without getting tired (increased endurance or stamina). Improving your ability to fight off illness (enhanced immunity). Being able to sleep better. Helping you stay healthy as you get older, including: Helping you stay mobile, or capable of walking and moving around. Preventing accidents, such as falls. Increasing life expectancy. Mental health benefits Boosting your mood and improving your self-esteem. Lowering your chance of having mental health problems, such as depression or anxiety. Helping you feel good about your body. Other benefits Finding new sources of fun and enjoyment. Meeting new people who share a common interest. Before you begin If you have a chronic illness or have not been active for a while, check with your health care provider about how to get started. Ask your health care provider what activities are safe for you. Start out slowly. Walking or doing some simple chair exercises is a good place to start, especially if you have not been active before or for a long time. Set goals that you can work toward. Ask your health care  provider how much exercise is best for you. In general, most adults should: Do moderate-intensity exercise for at least 150 minutes each week (30 minutes on most days of the week) or vigorous exercise for at least 75 minutes each week, or a combination of these. Moderate-intensity exercise can include walking at a quick pace, biking, yoga, water aerobics, or gardening. Vigorous exercise involves activities that take more effort, such as jogging or running, playing sports, swimming laps, or jumping rope. Do strength exercises on at least 2 days each week. This can include weight lifting, body weight exercises, and resistance-band exercises. How to be more physically active Make a plan  Try to find activities that you enjoy. You are more likely to commit to an exercise routine if it does not feel like a chore. If you have bone or joint problems, choose low-impact exercises, like walking or swimming. Use these tips for being successful with an exercise plan: Find a workout partner for accountability. Join a group or class, such as an aerobics class, cycling class, or sports team. Make family time active. Go for a walk, bike, or swim. Include a variety of exercises each week. Consider using a fitness tracker, such as a mobile phone app or a device worn like a watch, that will count the number of steps you take each day. Many people strive to reach 10,000 steps a day. Find ways to be active in your daily routines Besides your formal exercise plans, you can find ways to do physical activity during your daily routines, such as: Walking or biking to work or to the store. Taking the stairs instead of the elevator. Parking  farther away from the door at work or at the store. Planning walking meetings. Walking around while you are on the phone. Where to find more information Centers for Disease Control and Prevention: WorkDashboard.es President's Council on Fitness, Sports & Nutrition:  www.fitness.gov ChooseMyPlate: MassVoice.es Contact a health care provider if: You have headaches, muscle aches, or joint pain that is concerning. You feel dizzy or light-headed while exercising. You faint. You feel your heart skipping, racing, or fluttering. You have chest pain while exercising. Summary Exercise benefits your mind and body at any age, even if you are just starting out. If you have a chronic illness or have not been active for a while, check with your health care provider before increasing your physical activity. Choose activities that are safe and enjoyable for you. Ask your health care provider what activities are safe for you. Start slowly. Tell your health care provider if you have problems as you start to increase your activity level. This information is not intended to replace advice given to you by your health care provider. Make sure you discuss any questions you have with your health care provider. Document Revised: 07/03/2020 Document Reviewed: 07/03/2020 Elsevier Patient Education  [employer] Calories are units of energy. Your body needs a certain number of calories from food to keep going throughout the day. When you eat or drink more calories than your body needs, your body stores the extra calories mostly as fat. When you eat or drink fewer calories than your body needs, your body burns fat to get the energy it needs. Calorie counting means keeping track of how many calories you eat and drink each day. Calorie counting can be helpful if you need to lose weight. If you eat fewer calories than your body needs, you should lose weight. Ask your health care provider what a healthy weight is for you. For calorie counting to work, you will need to eat the right number of calories each day to lose a healthy amount of weight per week. A dietitian can help you figure out how many calories you need in a day and will suggest ways to  reach your calorie goal. A healthy amount of weight to lose each week is usually 1-2 lb (0.5-0.9 kg). This usually means that your daily calorie intake should be reduced by 500-750 calories. Eating 1,200-1,500 calories a day can help most women lose weight. Eating 1,500-1,800 calories a day can help most men lose weight. What do I need to know about calorie counting? Work with your health care provider or dietitian to determine how many calories you should get each day. To meet your daily calorie goal, you will need to: Find out how many calories are in each food that you would like to eat. Try to do this before you eat. Decide how much of the food you plan to eat. Keep a food log. Do this by writing down what you ate and how many calories it had. To successfully lose weight, it is important to balance calorie counting with a healthy lifestyle that includes regular activity. Where do I find calorie information? The number of calories in a food can be found on a Nutrition Facts label. If a food does not have a Nutrition Facts label, try to look up the calories online or ask your dietitian for help. Remember that calories are listed per serving. If you choose to have more than one serving of a food, you will  have to multiply the calories per serving by the number of servings you plan to eat. For example, the label on a package of bread might say that a serving size is 1 slice and that there are 90 calories in a serving. If you eat 1 slice, you will have eaten 90 calories. If you eat 2 slices, you will have eaten 180 calories. How do I keep a food log? After each time that you eat, record the following in your food log as soon as possible: What you ate. Be sure to include toppings, sauces, and other extras on the food. How much you ate. This can be measured in cups, ounces, or number of items. How many calories were in each food and drink. The total number of calories in the food you ate. Keep your  food log near you, such as in a pocket-sized notebook or on an app or website on your mobile phone. Some programs will calculate calories for you and show you how many calories you have left to meet your daily goal. What are some portion-control tips? Know how many calories are in a serving. This will help you know how many servings you can have of a certain food. Use a measuring cup to measure serving sizes. You could also try weighing out portions on a kitchen scale. With time, you will be able to estimate serving sizes for some foods. Take time to put servings of different foods on your favorite plates or in your favorite bowls and cups so you know what a serving looks like. Try not to eat straight from a food's packaging, such as from a bag or box. Eating straight from the package makes it hard to see how much you are eating and can lead to overeating. Put the amount you would like to eat in a cup or on a plate to make sure you are eating the right portion. Use smaller plates, glasses, and bowls for smaller portions and to prevent overeating. Try not to multitask. For example, avoid watching TV or using your computer while eating. If it is time to eat, sit down at a table and enjoy your food. This will help you recognize when you are full. It will also help you be more mindful of what and how much you are eating. What are tips for following this plan? Reading food labels Check the calorie count compared with the serving size. The serving size may be smaller than what you are used to eating. Check the source of the calories. Try to choose foods that are high in protein, fiber, and vitamins, and low in saturated fat, trans fat, and sodium. Shopping Read nutrition labels while you shop. This will help you make healthy decisions about which foods to buy. Pay attention to nutrition labels for low-fat or fat-free foods. These foods sometimes have the same number of calories or more calories than the  full-fat versions. They also often have added sugar, starch, or salt to make up for flavor that was removed with the fat. Make a grocery list of lower-calorie foods and stick to it. Cooking Try to cook your favorite foods in a healthier way. For example, try baking instead of frying. Use low-fat dairy products. Meal planning Use more fruits and vegetables. One-half of your plate should be fruits and vegetables. Include lean proteins, such as chicken, [place], and fish. Lifestyle Each week, aim to do one of the following: 150 minutes of moderate exercise, such as walking. 75 minutes  of vigorous exercise, such as running. General information Know how many calories are in the foods you eat most often. This will help you calculate calorie counts faster. Find a way of tracking calories that works for you. Get creative. Try different apps or programs if writing down calories does not work for you. What foods should I eat?  Eat nutritious foods. It is better to have a nutritious, high-calorie food, such as an avocado, than a food with few nutrients, such as a bag of potato chips. Use your calories on foods and drinks that will fill you up and will not leave you hungry soon after eating. Examples of foods that fill you up are nuts and nut butters, vegetables, lean proteins, and high-fiber foods such as whole grains. High-fiber foods are foods with more than 5 g of fiber per serving. Pay attention to calories in drinks. Low-calorie drinks include water and unsweetened drinks. The items listed above may not be a complete list of foods and beverages you can eat. Contact a dietitian for more information. What foods should I limit? Limit foods or drinks that are not good sources of vitamins, minerals, or protein or that are high in unhealthy fats. These include: Candy. Other sweets. Sodas, specialty coffee drinks, alcohol, and juice. The items listed above may not be a complete list of foods and  beverages you should avoid. Contact a dietitian for more information. How do I count calories when eating out? Pay attention to portions. Often, portions are much larger when eating out. Try these tips to keep portions smaller: Consider sharing a meal instead of getting your own. If you get your own meal, eat only half of it. Before you start eating, ask for a container and put half of your meal into it. When available, consider ordering smaller portions from the menu instead of full portions. Pay attention to your food and drink choices. Knowing the way food is cooked and what is included with the meal can help you eat fewer calories. If calories are listed on the menu, choose the lower-calorie options. Choose dishes that include vegetables, fruits, whole grains, low-fat dairy products, and lean proteins. Choose items that are boiled, broiled, grilled, or steamed. Avoid items that are buttered, battered, fried, or served with cream sauce. Items labeled as crispy are usually fried, unless stated otherwise. Choose water, low-fat milk, unsweetened iced tea, or other drinks without added sugar. If you want an alcoholic beverage, choose a lower-calorie option, such as a glass of wine or light beer. Ask for dressings, sauces, and syrups on the side. These are usually high in calories, so you should limit the amount you eat. If you want a salad, choose a garden salad and ask for grilled meats. Avoid extra toppings such as bacon, cheese, or fried items. Ask for the dressing on the side, or ask for olive oil and vinegar or lemon to use as dressing. Estimate how many servings of a food you are given. Knowing serving sizes will help you be aware of how much food you are eating at restaurants. Where to find more information Centers for Disease Control and Prevention: http://www.wolf.info/ U.[place]. Department of Agriculture: http://www.wilson-mendoza.org/ Summary Calorie counting means keeping track of how many calories you eat and drink each  day. If you eat fewer calories than your body needs, you should lose weight. A healthy amount of weight to lose per week is usually 1-2 lb (0.5-0.9 kg). This usually means reducing your daily calorie intake by 500-750  calories. The number of calories in a food can be found on a Nutrition Facts label. If a food does not have a Nutrition Facts label, try to look up the calories online or ask your dietitian for help. Use smaller plates, glasses, and bowls for smaller portions and to prevent overeating. Use your calories on foods and drinks that will fill you up and not leave you hungry shortly after a meal. This information is not intended to replace advice given to you by your health care provider. Make sure you discuss any questions you have with your health care provider. Document Revised: 04/18/2019 Document Reviewed: 04/18/2019 Elsevier Patient Education  2022 [employer].

## 2021-04-23 ENCOUNTER — Other Ambulatory Visit: Payer: Self-pay | Admitting: Nurse Practitioner

## 2021-04-23 LAB — VITAMIN D 1,25 DIHYDROXY
Vitamin D 1, 25 (OH)2 Total: 42 pg/mL (ref 18–72)
Vitamin D2 1, 25 (OH)2: <8 pg/mL
Vitamin D3 1, 25 (OH)2: 42 pg/mL

## 2021-07-21 ENCOUNTER — Ambulatory Visit: Payer: BC Managed Care – PPO | Admitting: Nurse Practitioner

## 2021-08-09 ENCOUNTER — Other Ambulatory Visit: Payer: Self-pay | Admitting: Obstetrics and Gynecology

## 2021-08-09 DIAGNOSIS — Z1231 Encounter for screening mammogram for malignant neoplasm of breast: Secondary | ICD-10-CM

## 2021-08-18 DIAGNOSIS — Z6827 Body mass index (BMI) 27.0-27.9, adult: Secondary | ICD-10-CM | POA: Diagnosis not present

## 2021-08-18 DIAGNOSIS — Z01419 Encounter for gynecological examination (general) (routine) without abnormal findings: Secondary | ICD-10-CM | POA: Diagnosis not present

## 2021-08-18 DIAGNOSIS — F419 Anxiety disorder, unspecified: Secondary | ICD-10-CM | POA: Diagnosis not present

## 2021-08-18 DIAGNOSIS — N952 Postmenopausal atrophic vaginitis: Secondary | ICD-10-CM | POA: Diagnosis not present

## 2021-09-08 ENCOUNTER — Ambulatory Visit: Payer: BC Managed Care – PPO

## 2021-09-13 ENCOUNTER — Other Ambulatory Visit: Payer: Self-pay | Admitting: Obstetrics and Gynecology

## 2021-09-13 ENCOUNTER — Ambulatory Visit
Admission: RE | Admit: 2021-09-13 | Discharge: 2021-09-13 | Disposition: A | Discharge status: Home / Self Care | Payer: BC Managed Care – PPO | Source: Ambulatory Visit | Attending: Obstetrics and Gynecology | Admitting: Obstetrics and Gynecology

## 2021-09-13 DIAGNOSIS — Z1231 Encounter for screening mammogram for malignant neoplasm of breast: Secondary | ICD-10-CM

## 2022-02-01 DIAGNOSIS — N952 Postmenopausal atrophic vaginitis: Secondary | ICD-10-CM | POA: Diagnosis not present

## 2022-03-02 DIAGNOSIS — D225 Melanocytic nevi of trunk: Secondary | ICD-10-CM | POA: Diagnosis not present

## 2022-03-02 DIAGNOSIS — L578 Other skin changes due to chronic exposure to nonionizing radiation: Secondary | ICD-10-CM | POA: Diagnosis not present

## 2022-03-02 DIAGNOSIS — L814 Other melanin hyperpigmentation: Secondary | ICD-10-CM | POA: Diagnosis not present

## 2022-03-02 DIAGNOSIS — L719 Rosacea, unspecified: Secondary | ICD-10-CM | POA: Diagnosis not present

## 2022-07-05 DIAGNOSIS — F4323 Adjustment disorder with mixed anxiety and depressed mood: Secondary | ICD-10-CM | POA: Diagnosis not present

## 2022-07-19 DIAGNOSIS — F320 Major depressive disorder, single episode, mild: Secondary | ICD-10-CM | POA: Diagnosis not present

## 2022-08-02 ENCOUNTER — Other Ambulatory Visit: Payer: Self-pay | Admitting: Obstetrics and Gynecology

## 2022-08-02 DIAGNOSIS — Z0000 Encounter for general adult medical examination without abnormal findings: Secondary | ICD-10-CM

## 2022-08-02 DIAGNOSIS — F320 Major depressive disorder, single episode, mild: Secondary | ICD-10-CM | POA: Diagnosis not present

## 2022-08-18 DIAGNOSIS — F320 Major depressive disorder, single episode, mild: Secondary | ICD-10-CM | POA: Diagnosis not present

## 2022-08-22 DIAGNOSIS — Z01419 Encounter for gynecological examination (general) (routine) without abnormal findings: Secondary | ICD-10-CM | POA: Diagnosis not present

## 2022-08-22 DIAGNOSIS — Z6827 Body mass index (BMI) 27.0-27.9, adult: Secondary | ICD-10-CM | POA: Diagnosis not present

## 2022-08-22 DIAGNOSIS — Z1272 Encounter for screening for malignant neoplasm of vagina: Secondary | ICD-10-CM | POA: Diagnosis not present

## 2022-08-24 DIAGNOSIS — F320 Major depressive disorder, single episode, mild: Secondary | ICD-10-CM | POA: Diagnosis not present

## 2022-09-06 DIAGNOSIS — F320 Major depressive disorder, single episode, mild: Secondary | ICD-10-CM | POA: Diagnosis not present

## 2022-09-15 ENCOUNTER — Ambulatory Visit
Admission: RE | Admit: 2022-09-15 | Discharge: 2022-09-15 | Disposition: A | Discharge status: Home / Self Care | Payer: BC Managed Care – PPO | Source: Ambulatory Visit | Attending: Obstetrics and Gynecology | Admitting: Obstetrics and Gynecology

## 2022-09-15 ENCOUNTER — Other Ambulatory Visit: Payer: Self-pay | Admitting: Obstetrics and Gynecology

## 2022-09-15 DIAGNOSIS — Z0000 Encounter for general adult medical examination without abnormal findings: Secondary | ICD-10-CM

## 2022-09-15 DIAGNOSIS — Z1231 Encounter for screening mammogram for malignant neoplasm of breast: Secondary | ICD-10-CM | POA: Diagnosis not present

## 2022-09-25 IMAGING — MG DIGITAL SCREENING BREAST BILAT IMPLANT W/ TOMO W/ CAD
8 of 13 series · 8 of 33 positions shown · non-contrast
Comparison: Previous exams.

CLINICAL DATA: Screening.

EXAM:
DIGITAL SCREENING BILATERAL MAMMOGRAM WITH IMPLANTS, CAD AND
TOMOSYNTHESIS
TECHNIQUE: Bilateral screening digital craniocaudal and mediolateral oblique
mammograms were obtained. Bilateral screening digital breast
tomosynthesis was performed. The images were evaluated with
computer-aided detection. Standard and/or implant displaced views
were performed.

[R MLO]
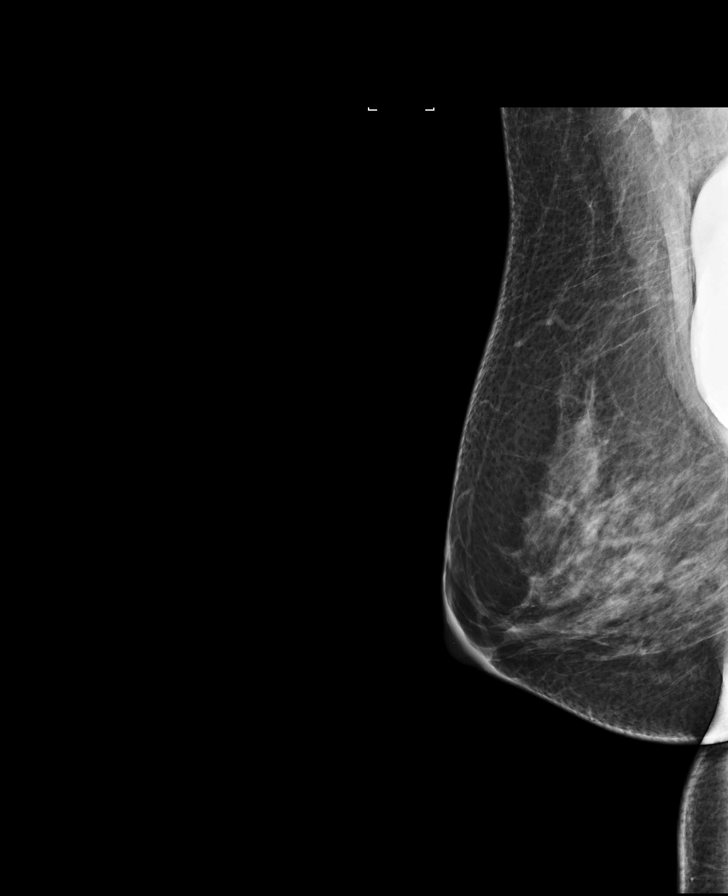

[R CC (1 of 2)]
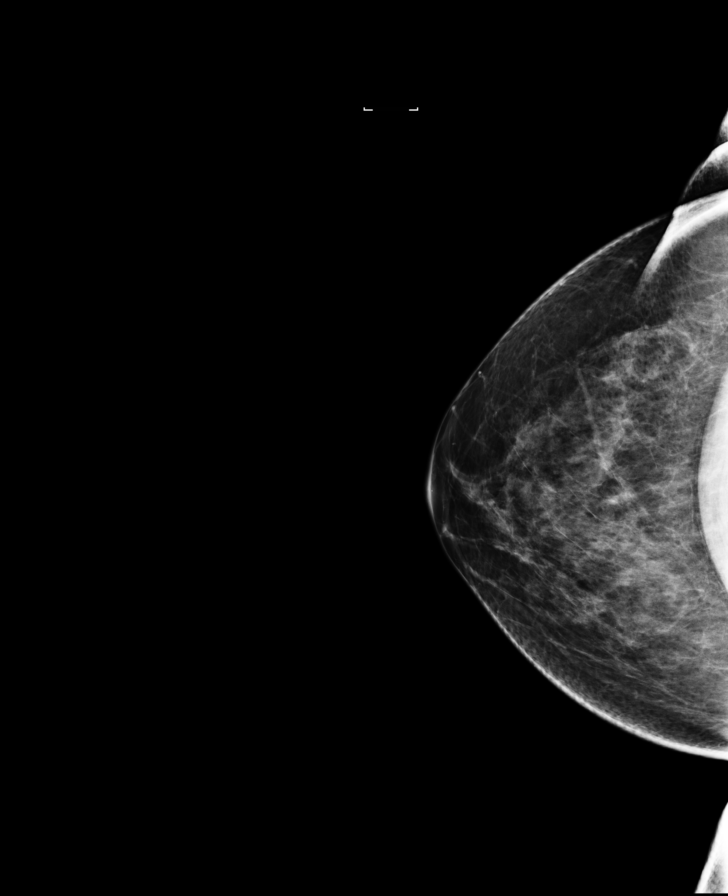

[R CC (2 of 2)]
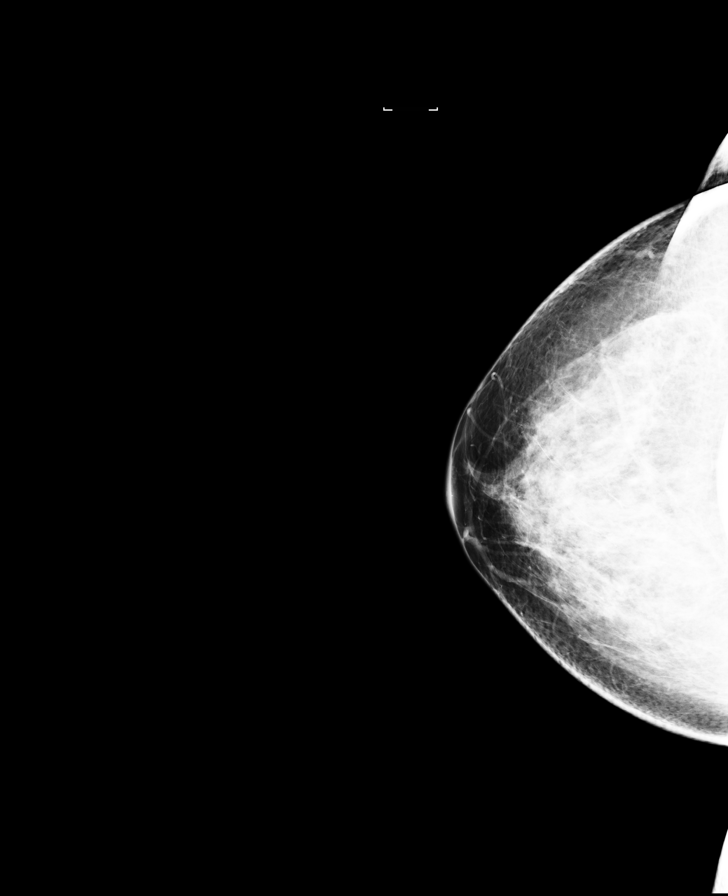

[L MLO synth-2D]
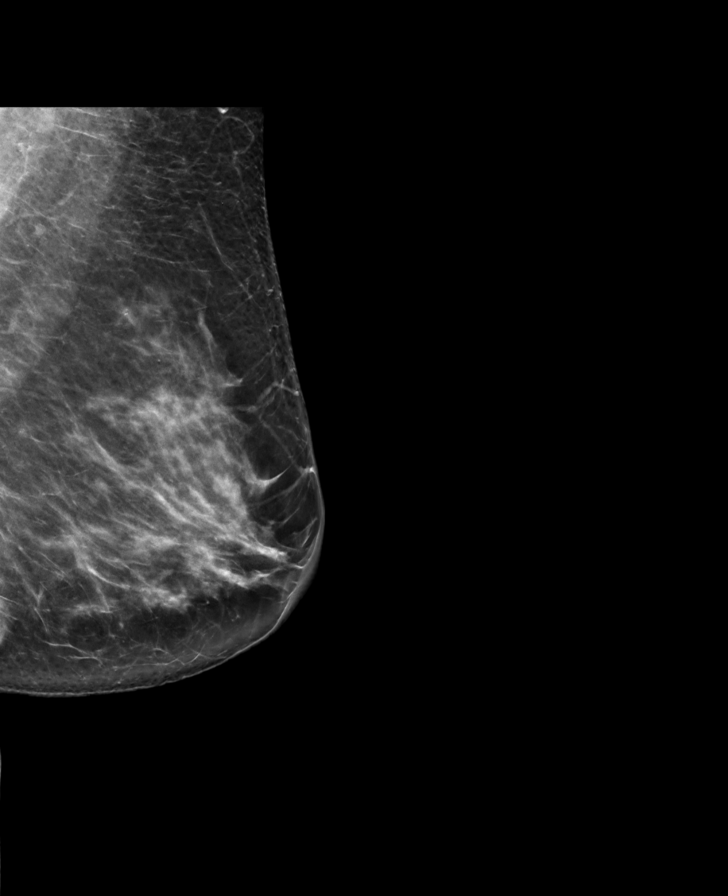

[R MLO synth-2D]
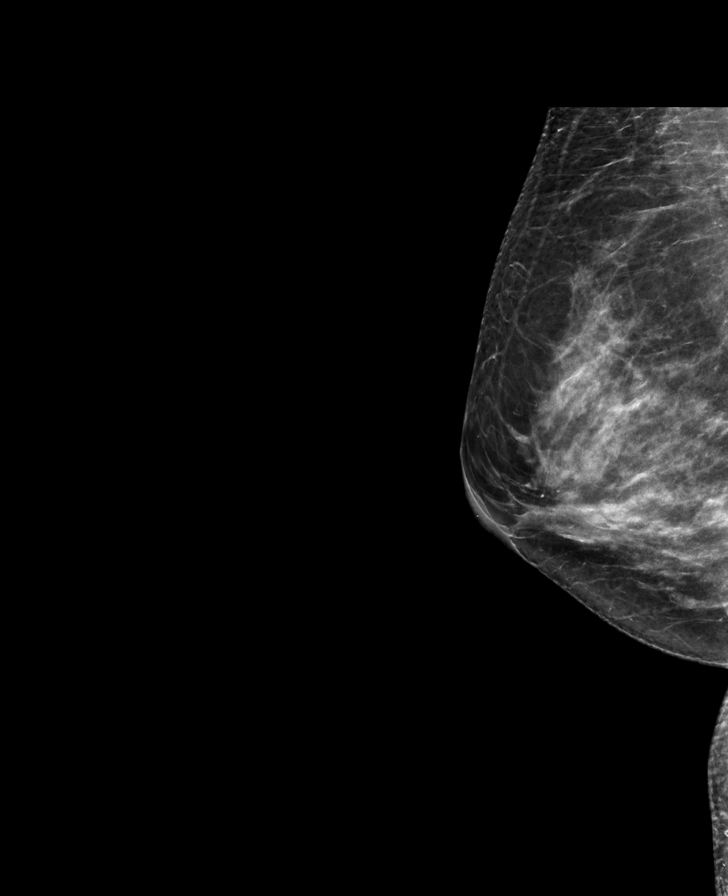

[L CC synth-2D]
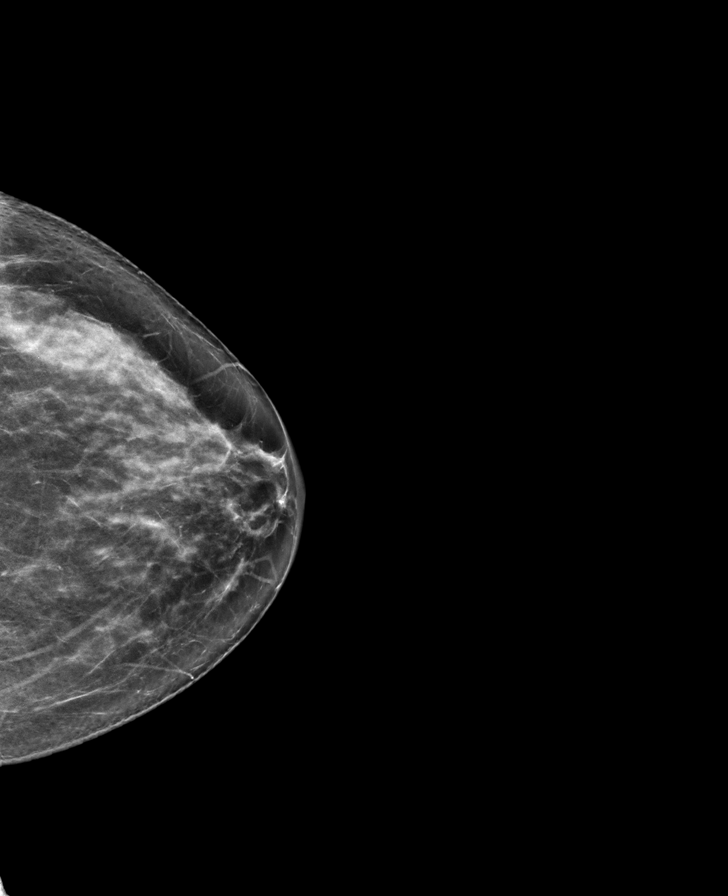

[R CC synth-2D (1 of 2)]
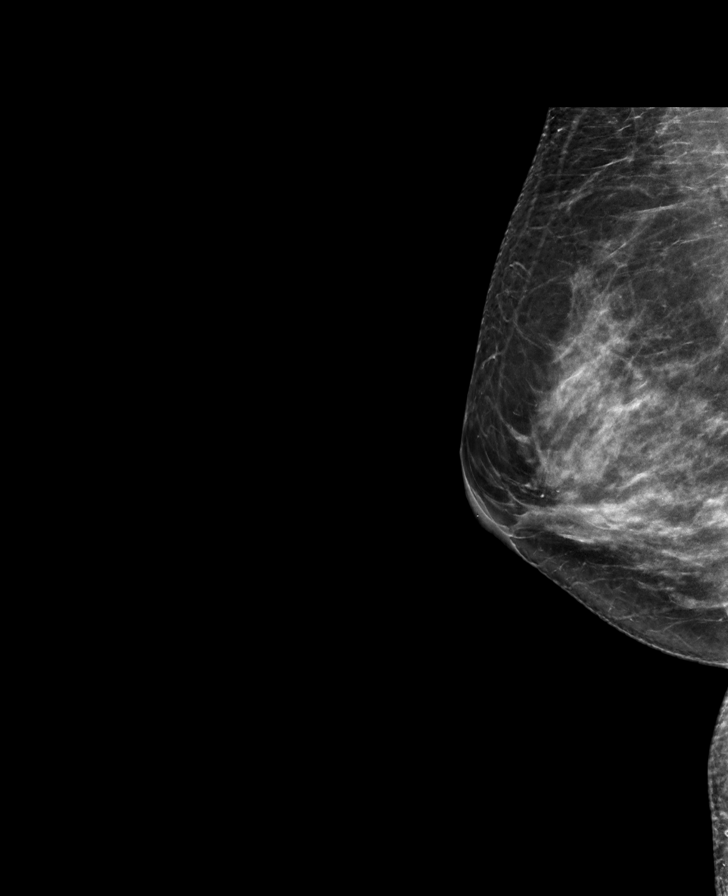

[R CC synth-2D (2 of 2)]
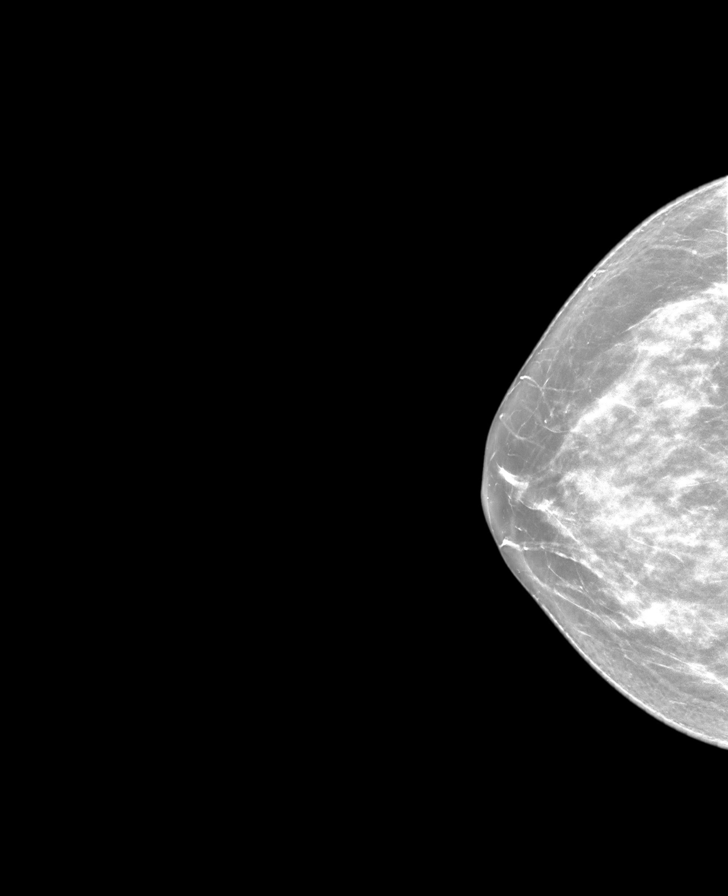

[8 of 33 positions shown; findings below may reference images not displayed]

ACR Breast Density Category c: The breast tissue is heterogeneously
dense, which may obscure small masses.
FINDINGS: The patient has a unilateral right retropectoral implant. There are
no findings suspicious for malignancy.
IMPRESSION: No mammographic evidence of malignancy. A result letter of this
screening mammogram will be mailed directly to the patient.

RECOMMENDATION:
Screening mammogram in one year. (Code:[id number])

BI-RADS CATEGORY  1:  Negative.

## 2022-09-26 DIAGNOSIS — F320 Major depressive disorder, single episode, mild: Secondary | ICD-10-CM | POA: Diagnosis not present

## 2022-10-03 DIAGNOSIS — F1190 Opioid use, unspecified, uncomplicated: Secondary | ICD-10-CM | POA: Diagnosis not present

## 2022-10-03 DIAGNOSIS — F331 Major depressive disorder, recurrent, moderate: Secondary | ICD-10-CM | POA: Diagnosis not present

## 2022-10-03 DIAGNOSIS — F419 Anxiety disorder, unspecified: Secondary | ICD-10-CM | POA: Diagnosis not present

## 2022-10-04 DIAGNOSIS — F320 Major depressive disorder, single episode, mild: Secondary | ICD-10-CM | POA: Diagnosis not present

## 2022-10-17 DIAGNOSIS — F320 Major depressive disorder, single episode, mild: Secondary | ICD-10-CM | POA: Diagnosis not present

## 2022-11-01 DIAGNOSIS — F419 Anxiety disorder, unspecified: Secondary | ICD-10-CM | POA: Diagnosis not present

## 2022-11-01 DIAGNOSIS — F331 Major depressive disorder, recurrent, moderate: Secondary | ICD-10-CM | POA: Diagnosis not present

## 2022-11-01 DIAGNOSIS — H43392 Other vitreous opacities, left eye: Secondary | ICD-10-CM | POA: Diagnosis not present

## 2022-11-03 DIAGNOSIS — F320 Major depressive disorder, single episode, mild: Secondary | ICD-10-CM | POA: Diagnosis not present

## 2022-11-18 DIAGNOSIS — F4320 Adjustment disorder, unspecified: Secondary | ICD-10-CM | POA: Diagnosis not present

## 2022-11-25 DIAGNOSIS — F4320 Adjustment disorder, unspecified: Secondary | ICD-10-CM | POA: Diagnosis not present

## 2022-12-02 DIAGNOSIS — F4320 Adjustment disorder, unspecified: Secondary | ICD-10-CM | POA: Diagnosis not present

## 2022-12-05 DIAGNOSIS — F4320 Adjustment disorder, unspecified: Secondary | ICD-10-CM | POA: Diagnosis not present

## 2022-12-07 DIAGNOSIS — F419 Anxiety disorder, unspecified: Secondary | ICD-10-CM | POA: Diagnosis not present

## 2022-12-07 DIAGNOSIS — F331 Major depressive disorder, recurrent, moderate: Secondary | ICD-10-CM | POA: Diagnosis not present

## 2022-12-30 DIAGNOSIS — F4320 Adjustment disorder, unspecified: Secondary | ICD-10-CM | POA: Diagnosis not present

## 2023-01-06 DIAGNOSIS — F4320 Adjustment disorder, unspecified: Secondary | ICD-10-CM | POA: Diagnosis not present

## 2023-01-20 DIAGNOSIS — F4320 Adjustment disorder, unspecified: Secondary | ICD-10-CM | POA: Diagnosis not present

## 2023-01-27 DIAGNOSIS — F4320 Adjustment disorder, unspecified: Secondary | ICD-10-CM | POA: Diagnosis not present

## 2023-02-03 DIAGNOSIS — F4320 Adjustment disorder, unspecified: Secondary | ICD-10-CM | POA: Diagnosis not present

## 2023-02-13 DIAGNOSIS — F4320 Adjustment disorder, unspecified: Secondary | ICD-10-CM | POA: Diagnosis not present

## 2023-02-24 DIAGNOSIS — F4320 Adjustment disorder, unspecified: Secondary | ICD-10-CM | POA: Diagnosis not present

## 2023-03-03 DIAGNOSIS — F4320 Adjustment disorder, unspecified: Secondary | ICD-10-CM | POA: Diagnosis not present

## 2023-03-08 DIAGNOSIS — L719 Rosacea, unspecified: Secondary | ICD-10-CM | POA: Diagnosis not present

## 2023-03-08 DIAGNOSIS — L578 Other skin changes due to chronic exposure to nonionizing radiation: Secondary | ICD-10-CM | POA: Diagnosis not present

## 2023-03-08 DIAGNOSIS — D225 Melanocytic nevi of trunk: Secondary | ICD-10-CM | POA: Diagnosis not present

## 2023-03-08 DIAGNOSIS — L814 Other melanin hyperpigmentation: Secondary | ICD-10-CM | POA: Diagnosis not present

## 2023-03-10 DIAGNOSIS — F4320 Adjustment disorder, unspecified: Secondary | ICD-10-CM | POA: Diagnosis not present

## 2023-03-24 DIAGNOSIS — F4320 Adjustment disorder, unspecified: Secondary | ICD-10-CM | POA: Diagnosis not present

## 2023-04-07 DIAGNOSIS — F4320 Adjustment disorder, unspecified: Secondary | ICD-10-CM | POA: Diagnosis not present

## 2023-04-14 DIAGNOSIS — F4320 Adjustment disorder, unspecified: Secondary | ICD-10-CM | POA: Diagnosis not present

## 2023-04-21 DIAGNOSIS — F4320 Adjustment disorder, unspecified: Secondary | ICD-10-CM | POA: Diagnosis not present

## 2023-05-02 DIAGNOSIS — F331 Major depressive disorder, recurrent, moderate: Secondary | ICD-10-CM | POA: Diagnosis not present

## 2023-05-02 DIAGNOSIS — F419 Anxiety disorder, unspecified: Secondary | ICD-10-CM | POA: Diagnosis not present

## 2023-05-05 DIAGNOSIS — F4320 Adjustment disorder, unspecified: Secondary | ICD-10-CM | POA: Diagnosis not present

## 2023-05-05 DIAGNOSIS — F32A Depression, unspecified: Secondary | ICD-10-CM | POA: Diagnosis not present

## 2023-05-19 DIAGNOSIS — F4320 Adjustment disorder, unspecified: Secondary | ICD-10-CM | POA: Diagnosis not present

## 2023-05-19 DIAGNOSIS — F32A Depression, unspecified: Secondary | ICD-10-CM | POA: Diagnosis not present

## 2023-05-23 ENCOUNTER — Encounter: Payer: Self-pay | Admitting: Nurse Practitioner

## 2023-05-23 ENCOUNTER — Other Ambulatory Visit: Payer: BC Managed Care – PPO

## 2023-05-23 ENCOUNTER — Ambulatory Visit (INDEPENDENT_AMBULATORY_CARE_PROVIDER_SITE_OTHER): Payer: BC Managed Care – PPO | Admitting: Nurse Practitioner

## 2023-05-23 VITALS — BP 118/68 | HR 72 | Temp 97.8°F | Ht 62.8 in | Wt 138.2 lb

## 2023-05-23 DIAGNOSIS — R6889 Other general symptoms and signs: Secondary | ICD-10-CM | POA: Diagnosis not present

## 2023-05-23 DIAGNOSIS — Z0001 Encounter for general adult medical examination with abnormal findings: Secondary | ICD-10-CM

## 2023-05-23 DIAGNOSIS — Z136 Encounter for screening for cardiovascular disorders: Secondary | ICD-10-CM

## 2023-05-23 DIAGNOSIS — Z13220 Encounter for screening for lipoid disorders: Secondary | ICD-10-CM

## 2023-05-23 DIAGNOSIS — Z0000 Encounter for general adult medical examination without abnormal findings: Secondary | ICD-10-CM | POA: Diagnosis not present

## 2023-05-23 LAB — TSH: TSH: 2.34 u[IU]/mL (ref 0.35–5.50)

## 2023-05-23 LAB — IBC + FERRITIN
Ferritin: 252 ng/mL (ref 10.0–291.0)
Iron: 113 ug/dL (ref 42–145)
Saturation Ratios: 32.5 % (ref 20.0–50.0)
TIBC: 347.2 ug/dL (ref 250.0–450.0)
Transferrin: 248 mg/dL (ref 212.0–360.0)

## 2023-05-23 LAB — LIPID PANEL
Cholesterol: 232 mg/dL — ABNORMAL HIGH (ref 0–200)
HDL: 66.4 mg/dL (ref >39.00)
LDL Cholesterol: 142 mg/dL — ABNORMAL HIGH (ref 0–99)
NonHDL: 165.43
Total CHOL/HDL Ratio: 3
Triglycerides: 117 mg/dL (ref 0.0–149.0)
VLDL: 23.4 mg/dL (ref 0.0–40.0)

## 2023-05-23 LAB — COMPREHENSIVE METABOLIC PANEL
ALT: 13 U/L (ref 0–35)
AST: 14 U/L (ref 0–37)
Albumin: 4.5 g/dL (ref 3.5–5.2)
Alkaline Phosphatase: 91 U/L (ref 39–117)
BUN: 22 mg/dL (ref 6–23)
CO2: 29 meq/L (ref 19–32)
Calcium: 9.6 mg/dL (ref 8.4–10.5)
Chloride: 104 meq/L (ref 96–112)
Creatinine, Ser: 0.85 mg/dL (ref 0.40–1.20)
GFR: 74.9 mL/min (ref >60.00)
Glucose, Bld: 92 mg/dL (ref 70–99)
Potassium: 4.2 meq/L (ref 3.5–5.1)
Sodium: 139 meq/L (ref 135–145)
Total Bilirubin: 0.5 mg/dL (ref 0.2–1.2)
Total Protein: 7 g/dL (ref 6.0–8.3)

## 2023-05-23 NOTE — Patient Instructions (Signed)
 Go to lab Maintain Heart healthy diet and daily exercise.  Preventive Care 43-60 Years Old, Female Preventive care refers to lifestyle choices and visits with your health care provider that can promote health and wellness. Preventive care visits are also called wellness exams. What can I expect for my preventive care visit? Counseling Your health care provider may ask you questions about your: Medical history, including: Past medical problems. Family medical history. Pregnancy history. Current health, including: Menstrual cycle. Method of birth control. Emotional well-being. Home life and relationship well-being. Sexual activity and sexual health. Lifestyle, including: Alcohol, nicotine or tobacco, and drug use. Access to firearms. Diet, exercise, and sleep habits. Work and work Astronomer. Sunscreen use. Safety issues such as seatbelt and bike helmet use. Physical exam Your health care provider will check your: Height and weight. These may be used to calculate your BMI (body mass index). BMI is a measurement that tells if you are at a healthy weight. Waist circumference. This measures the distance around your waistline. This measurement also tells if you are at a healthy weight and may help predict your risk of certain diseases, such as type 2 diabetes and high blood pressure. Heart rate and blood pressure. Body temperature. Skin for abnormal spots. What immunizations do I need?  Vaccines are usually given at various ages, according to a schedule. Your health care provider will recommend vaccines for you based on your age, medical history, and lifestyle or other factors, such as travel or where you work. What tests do I need? Screening Your health care provider may recommend screening tests for certain conditions. This may include: Lipid and cholesterol levels. Diabetes screening. This is done by checking your blood sugar (glucose) after you have not eaten for a while  (fasting). Pelvic exam and Pap test. Hepatitis B test. Hepatitis C test. HIV (human immunodeficiency virus) test. STI (sexually transmitted infection) testing, if you are at risk. Lung cancer screening. Colorectal cancer screening. Mammogram. Talk with your health care provider about when you should start having regular mammograms. This may depend on whether you have a family history of breast cancer. BRCA-related cancer screening. This may be done if you have a family history of breast, ovarian, tubal, or peritoneal cancers. Bone density scan. This is done to screen for osteoporosis. Talk with your health care provider about your test results, treatment options, and if necessary, the need for more tests. Follow these instructions at home: Eating and drinking  Eat a diet that includes fresh fruits and vegetables, whole grains, lean protein, and low-fat dairy products. Take vitamin and mineral supplements as recommended by your health care provider. Do not drink alcohol if: Your health care provider tells you not to drink. You are pregnant, may be pregnant, or are planning to become pregnant. If you drink alcohol: Limit how much you have to 0-1 drink a day. Know how much alcohol is in your drink. In the [place] one drink equals one 12 oz bottle of beer (355 mL), one 5 oz glass of wine (148 mL), or one 1 oz glass of hard liquor (44 mL). Lifestyle Brush your teeth every morning and night with fluoride toothpaste. Floss one time each day. Exercise for at least 30 minutes 5 or more days each week. Do not use any products that contain nicotine or tobacco. These products include cigarettes, chewing tobacco, and vaping devices, such as e-cigarettes. If you need help quitting, ask your health care provider. Do not use drugs. If you are sexually active, practice  safe sex. Use a condom or other form of protection to prevent STIs. If you do not wish to become pregnant, use a form of birth control. If  you plan to become pregnant, see your health care provider for a prepregnancy visit. Take aspirin only as told by your health care provider. Make sure that you understand how much to take and what form to take. Work with your health care provider to find out whether it is safe and beneficial for you to take aspirin daily. Find healthy ways to manage stress, such as: Meditation, yoga, or listening to music. Journaling. Talking to a trusted person. Spending time with friends and family. Minimize exposure to UV radiation to reduce your risk of skin cancer. Safety Always wear your seat belt while driving or riding in a vehicle. Do not drive: If you have been drinking alcohol. Do not ride with someone who has been drinking. When you are tired or distracted. While texting. If you have been using any mind-altering substances or drugs. Wear a helmet and other protective equipment during sports activities. If you have firearms in your house, make sure you follow all gun safety procedures. Seek help if you have been physically or sexually abused. What's next? Visit your health care provider once a year for an annual wellness visit. Ask your health care provider how often you should have your eyes and teeth checked. Stay up to date on all vaccines. This information is not intended to replace advice given to you by your health care provider. Make sure you discuss any questions you have with your health care provider. Document Revised: 09/02/2020 Document Reviewed: 09/02/2020 Elsevier Patient Education  2024 [employer].

## 2023-05-23 NOTE — Progress Notes (Signed)
 Complete physical exam  Patient: Audrey Wright   DOB: 06/27/1965   58 y.o. Female  MRN: [id number] Visit Date: 05/23/2023  Subjective:    Chief Complaint  Patient presents with   Annual Exam    Discuss vitamins that she should be taking and hormones labs     Audrey Wright is a 58 y.o. female who presents today for a complete physical exam. She reports consuming a general diet.  She Home exercise routine includes calisthenics and and walking daily.generally feels well. She reports sleeping well. She does have additional problems to discuss today.  Vision:Yes Dental:Yes STD Screen:No  BP Readings from Last 3 Encounters:  05/23/23 118/68  04/20/21 110/62  01/08/20 126/80   Wt Readings from Last 3 Encounters:  05/23/23 138 lb 3.2 oz (62.7 kg)  04/20/21 154 lb 6.4 oz (70 kg)  01/08/20 151 lb (68.5 kg)   Most recent fall risk assessment:    05/23/2023   11:04 AM  Fall Risk   Falls in the past year? 0  Number falls in past yr: 0  Injury with Fall? 0  Risk for fall due to : No Fall Risks  Follow up Falls prevention discussed;Falls evaluation completed   Depression screen:Yes - Depression  Most recent depression screenings:    05/23/2023   11:04 AM 04/20/2021   10:31 AM  PHQ 2/9 Scores  PHQ - 2 Score 0 0  PHQ- 9 Score 0 2    HPI  No problem-specific Assessment & Plan notes found for this encounter.  Past Medical History:  Diagnosis Date   Anxiety and depression    Dysplastic nevus of left upper extremity 2016   w/ moderate to severe melanocytic aptypia, left upper arm   Rosacea    Past Surgical History:  Procedure Laterality Date   AUGMENTATION MAMMAPLASTY Right 1982   BREAST CYST ASPIRATION  2012   COLONOSCOPY     LAPAROSCOPY     pelvic-- prior to 2000   PLACEMENT OF BREAST IMPLANTS Right 1987   POLYPECTOMY  2015   TA X 1   VAGINAL HYSTERECTOMY  2004   no oophorectomy   Social History   Socioeconomic History   Marital status: Married    Spouse name:  Not on file   Number of children: 2   Years of education: Not on file   Highest education level: Master's degree (e.g., MA, MS, MEng, MEd, MSW, MBA)  Occupational History   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, Sports administrator for counselors     Employer: [employer]  Tobacco Use   Smoking status: Never   Smokeless tobacco: Never  Vaping Use   Vaping status: Never Used  Substance and Sexual Activity   Alcohol use: Yes    Comment: social wine    Drug use: No   Sexual activity: Yes  Other Topics Concern   Not on file  Social History Narrative   Not on file   Social Drivers of Health   Financial Resource Strain: Low Risk  (05/22/2023)   Overall Financial Resource Strain (CARDIA)    Difficulty of Paying Living Expenses: Not hard at all  Food Insecurity: No Food Insecurity (05/22/2023)   Hunger Vital Sign    Worried About Running Out of Food in the Last Year: Never true    Ran Out of Food in the Last Year: Never true  Transportation Needs: No Transportation Needs (05/22/2023)   PRAPARE - Administrator, Civil Service (Medical): No  Lack of Transportation (Non-Medical): No  Physical Activity: Unknown (05/22/2023)   Exercise Vital Sign    Days of Exercise per Week: 3 days    Minutes of Exercise per Session: Not on file  Stress: No Stress Concern Present (05/22/2023)   [employer] of Occupational Health - Occupational Stress Questionnaire    Feeling of Stress : Not at all  Social Connections: Moderately Isolated (05/22/2023)   Social Connection and Isolation Panel [NHANES]    Frequency of Communication with Friends and Family: Twice a week    Frequency of Social Gatherings with Friends and Family: Twice a week    Attends Religious Services: Never    Diplomatic Services operational officer: No    Attends Engineer, structural: Not on file    Marital Status: Married  Catering manager Violence: Not on file   Family Status  Relation Name Status   Mother  Alive   MGM  (Not Specified)    MGF  (Not Specified)   Neg Hx  (Not Specified)  No partnership data on file   Family History  Problem Relation Age of Onset   Hypertension Mother    Hyperlipidemia Mother    Squamous cell carcinoma Mother    Breast cancer Maternal Grandmother        postmenopause breast   Pancreatic cancer Maternal Grandfather    Colon cancer Neg Hx    CAD Neg Hx    Stroke Neg Hx    Rectal cancer Neg Hx    Stomach cancer Neg Hx    Esophageal cancer Neg Hx    Colon polyps Neg Hx    Allergies  Allergen Reactions   Cephalosporins Hives   Penicillins Hives    Patient Care Team: Jaqualyn Juday, Bonna Gains, NP as PCP - General (Internal Medicine) Elmon Else, MD as Consulting Physician (Dermatology)   Medications: Outpatient Medications Prior to Visit  Medication Sig   FINACEA 15 % cream    zolpidem (AMBIEN) 5 MG tablet Take 5 mg by mouth at bedtime as needed.   buPROPion (WELLBUTRIN XL) 150 MG 24 hr tablet Take 150 mg by mouth every morning.   ketoconazole (NIZORAL) 2 % shampoo Apply 1 Application topically as needed for irritation.   [DISCONTINUED] doxycycline (VIBRA-TABS) 100 MG tablet Take 20 mg by mouth daily.  (Patient not taking: Reported on 05/23/2023)   [DISCONTINUED] Multiple Vitamins-Minerals (CENTRUM SILVER PO) Take by mouth. (Patient not taking: Reported on 05/23/2023)   [DISCONTINUED] venlafaxine XR (EFFEXOR-XR) 150 MG 24 hr capsule Take 150 mg by mouth daily. (Patient not taking: Reported on 05/23/2023)   No facility-administered medications prior to visit.   Review of Systems  Constitutional:  Positive for chills. Negative for activity change, appetite change, fatigue, fever and unexpected weight change.  Respiratory: Negative.    Cardiovascular: Negative.   Gastrointestinal: Negative.   Endocrine: Positive for cold intolerance. Negative for heat intolerance.  Genitourinary: Negative.   Musculoskeletal: Negative.   Skin: Negative.   Neurological: Negative.   Hematological: Negative.    Psychiatric/Behavioral:  Negative for behavioral problems, decreased concentration, dysphoric mood, hallucinations, self-injury, sleep disturbance and suicidal ideas. The patient is not nervous/anxious.        Objective:  BP 118/68 (BP Location: Right Arm, Patient Position: Sitting, Cuff Size: Normal)   Pulse 72   Temp 97.8 F (36.6 C) (Temporal)   Ht 5' 2.8" (1.595 m)   Wt 138 lb 3.2 oz (62.7 kg)   SpO2 100%   BMI 24.64  kg/m     Physical Exam Vitals and nursing note reviewed.  Constitutional:      General: She is not in acute distress. HENT:     Right Ear: Tympanic membrane, ear canal and external ear normal.     Left Ear: Tympanic membrane, ear canal and external ear normal.     Nose: Nose normal.  Eyes:     Extraocular Movements: Extraocular movements intact.     Conjunctiva/sclera: Conjunctivae normal.     Pupils: Pupils are equal, round, and reactive to light.  Neck:     Thyroid: No thyroid mass, thyromegaly or thyroid tenderness.  Cardiovascular:     Rate and Rhythm: Normal rate and regular rhythm.     Pulses: Normal pulses.     Heart sounds: Normal heart sounds.  Pulmonary:     Effort: Pulmonary effort is normal.     Breath sounds: Normal breath sounds.  Abdominal:     General: Bowel sounds are normal.     Palpations: Abdomen is soft.  Musculoskeletal:        General: Normal range of motion.     Cervical back: Normal range of motion and neck supple.     Right lower leg: No edema.     Left lower leg: No edema.  Lymphadenopathy:     Cervical: No cervical adenopathy.  Skin:    General: Skin is warm and dry.  Neurological:     Mental Status: She is alert and oriented to person, place, and time.     Cranial Nerves: No cranial nerve deficit.  Psychiatric:        Mood and Affect: Mood normal.        Behavior: Behavior normal.        Thought Content: Thought content normal.     No results found for any visits on 05/23/23.    Assessment & Plan:    Routine  Health Maintenance and Physical Exam  Immunization History  Administered Date(s) Administered   Influenza Inj Mdck Quad Pf 01/10/2022   Influenza Whole 02/02/2010   Influenza, Seasonal, Injecte, Preservative Fre 01/22/2023   Influenza,inj,quad, With Preservative 01/11/2018   Influenza-Unspecified 01/22/2016, 01/14/2017, 01/14/2021, 01/22/2023   PFIZER(Purple Top)SARS-COV-2 Vaccination 05/17/2019, 06/07/2019, 02/25/2020   Pfizer Covid-19 Vaccine Bivalent Booster 8yrs & up 12/17/2020   Pfizer(Comirnaty)Fall Seasonal Vaccine 12 years and older 12/16/2021, 01/22/2023   Td 01/14/2002   Tdap 05/23/2013   Unspecified SARS-COV-2 Vaccination 05/17/2019, 06/07/2019, 02/25/2020, 07/31/2020, 12/17/2020   Zoster Recombinant(Shingrix) 01/08/2020, 05/12/2020   Zoster, Live 01/08/2020, 05/12/2020   Health Maintenance  Topic Date Due   DTaP/Tdap/Td (3 - Td or Tdap) 05/24/2023   MAMMOGRAM  09/15/2023   Colonoscopy  01/16/2026   INFLUENZA VACCINE  Completed   COVID-19 Vaccine  Completed   Hepatitis C Screening  Completed   HIV Screening  Completed   Zoster Vaccines- Shingrix  Completed   HPV VACCINES  Aged Out   Discussed health benefits of physical activity, and encouraged her to engage in regular exercise appropriate for her age and condition. Advised to maintain vitamin D3 1000IU and calcium 600mg  daily for bone health. No need for probiotic unless she has GI symptoms, recurrent BV/yeast/UTI. Ok to use topical minoxidil for hair loss per dermatology. Advised about potential side effects.  Problem List Items Addressed This Visit   None Visit Diagnoses       Encounter for preventative adult health care exam with abnormal findings    -  Primary   Relevant Orders  Comprehensive metabolic panel     Encounter for lipid screening for cardiovascular disease       Relevant Orders   Lipid panel     Cold intolerance       Relevant Orders   IBC + Ferritin   TSH      Return in about 1 year  (around 05/22/2024) for CPE (fasting).     Alysia Penna, NP

## 2023-05-25 ENCOUNTER — Encounter: Payer: Self-pay | Admitting: Nurse Practitioner

## 2023-06-02 DIAGNOSIS — F32A Depression, unspecified: Secondary | ICD-10-CM | POA: Diagnosis not present

## 2023-06-02 DIAGNOSIS — F4320 Adjustment disorder, unspecified: Secondary | ICD-10-CM | POA: Diagnosis not present

## 2023-06-16 DIAGNOSIS — F32A Depression, unspecified: Secondary | ICD-10-CM | POA: Diagnosis not present

## 2023-06-16 DIAGNOSIS — F4320 Adjustment disorder, unspecified: Secondary | ICD-10-CM | POA: Diagnosis not present

## 2023-06-30 ENCOUNTER — Encounter: Payer: Self-pay | Admitting: Nurse Practitioner

## 2023-06-30 DIAGNOSIS — F32A Depression, unspecified: Secondary | ICD-10-CM | POA: Diagnosis not present

## 2023-06-30 DIAGNOSIS — J309 Allergic rhinitis, unspecified: Secondary | ICD-10-CM

## 2023-06-30 DIAGNOSIS — F4320 Adjustment disorder, unspecified: Secondary | ICD-10-CM | POA: Diagnosis not present

## 2023-06-30 NOTE — Telephone Encounter (Signed)
 Patient last seen by you in office was on 05/23/23 (CPE). Would you like for me to get her scheduled for an appointment to further access or just send the referral to allergy?

## 2023-07-01 ENCOUNTER — Other Ambulatory Visit: Payer: Self-pay

## 2023-07-01 ENCOUNTER — Ambulatory Visit
Admission: EM | Admit: 2023-07-01 | Discharge: 2023-07-01 | Disposition: A | Discharge status: Home / Self Care | Attending: Family Medicine | Admitting: Family Medicine

## 2023-07-01 DIAGNOSIS — J309 Allergic rhinitis, unspecified: Secondary | ICD-10-CM

## 2023-07-01 DIAGNOSIS — H1032 Unspecified acute conjunctivitis, left eye: Secondary | ICD-10-CM

## 2023-07-01 MED ORDER — GENTAMICIN SULFATE 0.3 % OP SOLN: 2.0000 [drp] | Freq: Three times a day (TID) | OPHTHALMIC | 0 refills | Status: AC

## 2023-07-01 NOTE — ED Provider Notes (Signed)
 [facility]    CSN: [id number] Arrival date & time: 07/01/23  4098      History   Chief Complaint Chief Complaint  Patient presents with   Eye Problem    HPI Audrey Wright is a 58 y.o. female.    Eye Problem Here for irritation, redness, and drainage of her left eye.  This first noted early this morning.  No fever.  The last 2 weeks or so she has had some cough and sneezing and several symptoms that she feels are most likely allergy.  She has already been evaluated by her primary care and has referral to an allergist.  She is allergic to cephalosporins and cellulitis.  Past Medical History:  Diagnosis Date   Anxiety and depression    Dysplastic nevus of left upper extremity 2016   w/ moderate to severe melanocytic aptypia, left upper arm   Rosacea     Patient Active Problem List   Diagnosis Date Noted   Family history of malignant melanoma 04/20/2021   Family history of pancreatic cancer 04/20/2021   Vitamin D deficiency 01/08/2020   History of laparoscopic-assisted vaginal hysterectomy 08/12/2019   NECK PAIN, CHRONIC 06/06/2008   Anxiety 04/27/2007   FOOT PAIN, RIGHT 04/27/2007    Past Surgical History:  Procedure Laterality Date   AUGMENTATION MAMMAPLASTY Right 1982   BREAST CYST ASPIRATION  2012   COLONOSCOPY     LAPAROSCOPY     pelvic-- prior to 2000   PLACEMENT OF BREAST IMPLANTS Right 1987   POLYPECTOMY  2015   TA X 1   VAGINAL HYSTERECTOMY  2004   no oophorectomy    OB History   No obstetric history on file.      Home Medications    Prior to Admission medications   Medication Sig Start Date End Date Taking? Authorizing Provider  gentamicin (GARAMYCIN) 0.3 % ophthalmic solution Place 2 drops into the left eye 3 (three) times daily for 5 days. 07/01/23 07/06/23 Yes Melchizedek Espinola K, MD  buPROPion (WELLBUTRIN XL) 150 MG 24 hr tablet Take 150 mg by mouth every morning.    [provider]  FINACEA 15 % cream  03/19/13    [provider]  ketoconazole (NIZORAL) 2 % shampoo Apply 1 Application topically as needed for irritation.    [provider]  zolpidem (AMBIEN) 5 MG tablet Take 5 mg by mouth at bedtime as needed. 05/02/23   [provider]    Family History Family History  Problem Relation Age of Onset   Hypertension Mother    Hyperlipidemia Mother    Squamous cell carcinoma Mother    Breast cancer Maternal Grandmother        postmenopause breast   Pancreatic cancer Maternal Grandfather    Colon cancer Neg Hx    CAD Neg Hx    Stroke Neg Hx    Rectal cancer Neg Hx    Stomach cancer Neg Hx    Esophageal cancer Neg Hx    Colon polyps Neg Hx     Social History Social History   Tobacco Use   Smoking status: Never   Smokeless tobacco: Never  Vaping Use   Vaping status: Never Used  Substance Use Topics   Alcohol use: Yes    Comment: social wine    Drug use: No     Allergies   Cephalosporins and Penicillins   Review of Systems Review of Systems   Physical Exam Triage Vital Signs ED Triage  Vitals  Encounter Vitals Group     BP 07/01/23 0912 138/88     Systolic BP Percentile --      Diastolic BP Percentile --      Pulse Rate 07/01/23 0912 82     Resp 07/01/23 0912 18     Temp 07/01/23 0912 98.1 F (36.7 C)     Temp Source 07/01/23 0912 Oral     SpO2 07/01/23 0912 97 %     Weight 07/01/23 0913 137 lb (62.1 kg)     Height 07/01/23 0913 5' 2.5" (1.588 m)     Head Circumference --      Peak Flow --      Pain Score 07/01/23 0912 0     Pain Loc --      Pain Education --      Exclude from Growth Chart --    No data found.  Updated Vital Signs BP 138/88 (BP Location: Right Arm)   Pulse 82   Temp 98.1 F (36.7 C) (Oral)   Resp 18   Ht 5' 2.5" (1.588 m)   Wt 62.1 kg   SpO2 97%   BMI 24.66 kg/m   Visual Acuity Right Eye Distance:   Left Eye Distance:   Bilateral Distance:    Right Eye Near:   Left Eye Near:    Bilateral Near:      Physical Exam Vitals reviewed.  Constitutional:      General: She is not in acute distress.    Appearance: She is not toxic-appearing.  HENT:     Nose: Nose normal.     Mouth/Throat:     Mouth: Mucous membranes are moist.     Pharynx: No oropharyngeal exudate or posterior oropharyngeal erythema.  Eyes:     Extraocular Movements: Extraocular movements intact.     Pupils: Pupils are equal, round, and reactive to light.     Comments: There is injection of the left conjunctiva with some discharge on the medial lower lid.  There is no swelling of the eyelids.  Cardiovascular:     Rate and Rhythm: Normal rate and regular rhythm.     Heart sounds: No murmur heard. Pulmonary:     Effort: Pulmonary effort is normal. No respiratory distress.     Breath sounds: No stridor. No wheezing, rhonchi or rales.  Musculoskeletal:     Cervical back: Neck supple.  Lymphadenopathy:     Cervical: No cervical adenopathy.  Skin:    Capillary Refill: Capillary refill takes less than 2 seconds.     Coloration: Skin is not jaundiced or pale.  Neurological:     General: No focal deficit present.     Mental Status: She is alert and oriented to person, place, and time.  Psychiatric:        Behavior: Behavior normal.      UC Treatments / Results  Labs (all labs ordered are listed, but only abnormal results are displayed) Labs Reviewed - No data to display  EKG   Radiology No results found.  Procedures Procedures (including critical care time)  Medications Ordered in UC Medications - No data to display  Initial Impression / Assessment and Plan / UC Course  I have reviewed the triage vital signs and the nursing notes.  Pertinent labs & imaging results that were available during my care of the patient were reviewed by me and considered in my medical decision making (see chart for details).     Gentamicin ophthalmic solution is  sent in to treat the conjunctivitis.  I have recommended  Allegra or Claritin for the allergies, to try. Final Clinical Impressions(s) / UC Diagnoses   Final diagnoses:  Acute conjunctivitis of left eye, unspecified acute conjunctivitis type  Allergic rhinitis, unspecified seasonality, unspecified trigger     Discharge Instructions      Put gentamicin eyedrops in the affected eye(s) 3 times daily for 5 days.  Also, you could try Allegra/fexofenadine or Claritin/loratadine as needed for the allergy symptoms.  They do not cause drowsiness.     ED Prescriptions     Medication Sig Dispense Auth. Provider   gentamicin (GARAMYCIN) 0.3 % ophthalmic solution Place 2 drops into the left eye 3 (three) times daily for 5 days. 5 mL Ann Keto, MD      PDMP not reviewed this encounter.   Ann Keto, MD 07/01/23 0930

## 2023-07-01 NOTE — ED Triage Notes (Signed)
 Pt presents with complaints of left eye redness and watery discharge that began this morning 4/12 upon wakening. Pt currently denies pain. No medications applied or taken PTA for symptoms.

## 2023-07-01 NOTE — Discharge Instructions (Signed)
 Put gentamicin eyedrops in the affected eye(s) 3 times daily for 5 days.  Also, you could try Allegra/fexofenadine or Claritin/loratadine as needed for the allergy symptoms.  They do not cause drowsiness.

## 2023-07-11 DIAGNOSIS — F4320 Adjustment disorder, unspecified: Secondary | ICD-10-CM | POA: Diagnosis not present

## 2023-07-14 DIAGNOSIS — F419 Anxiety disorder, unspecified: Secondary | ICD-10-CM | POA: Diagnosis not present

## 2023-07-14 DIAGNOSIS — F331 Major depressive disorder, recurrent, moderate: Secondary | ICD-10-CM | POA: Diagnosis not present

## 2023-07-24 ENCOUNTER — Other Ambulatory Visit: Payer: Self-pay

## 2023-07-24 ENCOUNTER — Ambulatory Visit (INDEPENDENT_AMBULATORY_CARE_PROVIDER_SITE_OTHER): Payer: Self-pay | Admitting: Internal Medicine

## 2023-07-24 ENCOUNTER — Encounter: Payer: Self-pay | Admitting: Internal Medicine

## 2023-07-24 VITALS — BP 128/80 | HR 60 | Temp 98.2°F | Resp 16 | Ht 63.0 in | Wt 142.6 lb

## 2023-07-24 DIAGNOSIS — L508 Other urticaria: Secondary | ICD-10-CM

## 2023-07-24 DIAGNOSIS — J310 Chronic rhinitis: Secondary | ICD-10-CM | POA: Diagnosis not present

## 2023-07-24 NOTE — Patient Instructions (Signed)
 Chronic rhinitis-allergic vs nonallergic (viral, etc) Symptoms occurred post-cat exposure and during  peak pollen season. Differential includes viral infection vs allergic rhinitis. No history of allergies or asthma. - Schedule allergy testing for next Monday at 2:30 PM. - Avoid antihistamines for three days prior to testing. - Consider Flonase daily for nasal drainage; it will not interfere with testing. Can use 1 spray in each nostril twice daily.   Penicillin and cephalosporin allergy Hives occurred with penicillin and cephalosporins over 25 years ago. -consider separate appointment for penicillin testing followed by graded oral challenge if negative  -80% of individuals outgrow this allergy in a 10 year span  Follow up : next Monday May 12th at 2: 30 pm (1-55) Stop antihistamines 3 days prior to visit.  It was a pleasure meeting you in clinic today! Thank you for allowing me to participate in your care.

## 2023-07-24 NOTE — Progress Notes (Signed)
 NEW PATIENT Date of Service/Encounter:  07/24/23 Referring provider: Kandace Organ, NP Primary care provider: Kandace Organ, NP  Subjective:  Audrey Wright is a 58 y.o. female presenting today for evaluation of chronic rhinitis. History obtained from: chart review and patient.  Discussed the use of AI scribe software for clinical note transcription with the patient, who gave verbal consent to proceed.  History of Present Illness   Audrey Wright is a 58 year old female who presents with allergy-like symptoms.  She began experiencing symptoms after fostering her son's cat on March 29th, coinciding with pollen season. Five days later, she vacuumed a large amount of pollen from her porch, and the following day developed laryngitis lasting two days.  Her symptoms worsened over the next two weeks, starting with laryngitis and progressing to sinus pressure, headaches, and rhinorrhea without significant nasal discharge. Approximately a week and a half later, symptoms moved to her chest, causing chest congestion and a persistent nocturnal cough.  She has no prior history of allergies, asthma, or eczema. Her known medication allergies include penicillin and cephalosporins, both causing hives in the past. She has not taken any medications for her current symptoms, except for trying Benadryl once to aid sleep, which provided slight relief.  She also experienced conjunctivitis in one eye about a week and a half after symptom onset. Her symptoms have improved, but she still experiences some residual issues at night.      Chart Review:  Reviewed PCP notes from referral 06/30/23: increased rhinitis symptoms with new acquisition of cat ER visit 07/01/23 for acute conjunctivitis, started on gentamicin  ophthalmic drops and OTC aH PRN   Past Medical History: Past Medical History:  Diagnosis Date   Anxiety and depression    Dysplastic nevus of left upper extremity 2016   w/ moderate to  severe melanocytic aptypia, left upper arm   Rosacea    Medication List:  Current Outpatient Medications  Medication Sig Dispense Refill   buPROPion (WELLBUTRIN XL) 150 MG 24 hr tablet Take 150 mg by mouth every morning.     FINACEA 15 % cream      ketoconazole (NIZORAL) 2 % shampoo Apply 1 Application topically as needed for irritation.     zolpidem (AMBIEN) 5 MG tablet Take 5 mg by mouth at bedtime as needed.     No current facility-administered medications for this visit.   Known Allergies:  Allergies  Allergen Reactions   Cephalosporins Hives   Penicillins Hives   Past Surgical History: Past Surgical History:  Procedure Laterality Date   AUGMENTATION MAMMAPLASTY Right 1982   BREAST CYST ASPIRATION  2012   COLONOSCOPY     LAPAROSCOPY     pelvic-- prior to 2000   PLACEMENT OF BREAST IMPLANTS Right 1987   POLYPECTOMY  2015   TA X 1   VAGINAL HYSTERECTOMY  2004   no oophorectomy   Family History: Family History  Problem Relation Age of Onset   Hypertension Mother    Hyperlipidemia Mother    Squamous cell carcinoma Mother    Breast cancer Maternal Grandmother        postmenopause breast   Pancreatic cancer Maternal Grandfather    Colon cancer Neg Hx    CAD Neg Hx    Stroke Neg Hx    Rectal cancer Neg Hx    Stomach cancer Neg Hx    Esophageal cancer Neg Hx    Colon polyps Neg Hx    Social History:  Audrey Wright lives in a house built 29 years ago, no water damage, carpet in the bedroom, casein, central AC, 1 indoor cat, elder cats and dogs, no roaches, using does not covers on the bed and not pillows, no smoke exposure.  She is a retired, she is exposed to fumes chemicals or dust at her previous job.  No HEPA filter in the home.  [place]  ROS:  All other systems negative except as noted per HPI.  Objective:  Blood pressure 128/80, pulse 60, temperature 98.2 F (36.8 C), temperature source Temporal, resp. rate 16, height 5\' 3"  (1.6 m), weight  142 lb 9.6 oz (64.7 kg), SpO2 100%. Body mass index is 25.26 kg/m. Physical Exam:  General Appearance:  Alert, cooperative, no distress, appears stated age  Head:  Normocephalic, without obvious abnormality, atraumatic  Eyes:  Conjunctiva clear, EOM's intact  Ears EACs normal bilaterally and normal TMs bilaterally  Nose: Nares normal, hypertrophic turbinates, normal mucosa, and no visible anterior polyps  Throat: Lips, tongue normal; teeth and gums normal, normal posterior oropharynx  Neck: Supple, symmetrical  Lungs:   clear to auscultation bilaterally, Respirations unlabored, no coughing  Heart:  regular rate and rhythm and no murmur, Appears well perfused  Extremities: No edema  Skin: Skin color, texture, turgor normal and no rashes or lesions on visualized portions of skin  Neurologic: No gross deficits   Diagnostics:  Labs:  Lab Orders  No laboratory test(s) ordered today     Assessment and Plan  Assessment and Plan    Chronic rhinitis-allergic vs nonallergic (viral, etc) Symptoms occurred post-cat exposure and during  peak pollen season. Differential includes viral infection vs allergic rhinitis. No history of allergies or asthma. - Schedule allergy testing for next Monday at 2:30 PM. - Avoid antihistamines for three days prior to testing. - Consider Flonase daily for nasal drainage; it will not interfere with testing. Can use 1 spray in each nostril twice daily.   Penicillin and cephalosporin allergy Hives occurred with penicillin and cephalosporins over 25 years ago. -consider separate appointment for penicillin testing followed by graded oral challenge if negative  -80% of individuals outgrow this allergy in a 10 year span  Follow up : next Monday May 12th at 2: 30 pm (1-55) Stop antihistamines 3 days prior to visit.  It was a pleasure meeting you in clinic today! Thank you for allowing me to participate in your care.  Jonathon Neighbors, MD [facility]         This note in its entirety was forwarded to the Provider who requested this consultation.  Other: none  Thank you for your kind referral. I appreciate the opportunity to take part in Audrey Wright's care. Please do not hesitate to contact me with questions.  Sincerely,  Jonathon Neighbors, MD [facility] 

## 2023-07-28 DIAGNOSIS — F4320 Adjustment disorder, unspecified: Secondary | ICD-10-CM | POA: Diagnosis not present

## 2023-07-28 DIAGNOSIS — F32A Depression, unspecified: Secondary | ICD-10-CM | POA: Diagnosis not present

## 2023-07-31 ENCOUNTER — Encounter: Payer: Self-pay | Admitting: Internal Medicine

## 2023-07-31 ENCOUNTER — Ambulatory Visit (INDEPENDENT_AMBULATORY_CARE_PROVIDER_SITE_OTHER): Admitting: Internal Medicine

## 2023-07-31 DIAGNOSIS — J302 Other seasonal allergic rhinitis: Secondary | ICD-10-CM | POA: Insufficient documentation

## 2023-07-31 DIAGNOSIS — J3089 Other allergic rhinitis: Secondary | ICD-10-CM

## 2023-07-31 NOTE — Progress Notes (Signed)
 Date of Service/Encounter:  07/31/23  Allergy testing appointment   Initial visit on 07/24/23, seen for chronic rhinitis, penicillin allergy and cephalosporin allergy.  Please see that note for additional details.  Today reports for allergy diagnostic testing:    DIAGNOSTICS:  Skin Testing: Environmental allergy panel. Adequate positive and negative controls. Results discussed with patient/family.  Airborne Adult Perc - 07/31/23 1426     Time Antigen Placed 1426    Allergen Manufacturer Floyd Hutchinson    Location Back    Number of Test 55    2. Control-Histamine 3+    3. Bahia Negative    4. French Southern Territories Negative    5. Johnson 2+    6. [place]  Blue Negative    7. Meadow Fescue Negative    8. Perennial Rye Negative    9. Timothy Negative    10. Ragweed Mix Negative    11. Cocklebur Negative    12. Plantain,  English Negative    13. Baccharis Negative    14. Dog Fennel Negative    15. Russian Thistle Negative    16. Lamb's Quarters Negative    17. Sheep Sorrell Negative    18. Rough Pigweed Negative    19. Marsh Elder, Rough Negative    20. Mugwort, Common Negative    21. Box, Elder Negative    22. Cedar, red Negative    23. Sweet Gum Negative    24. Pecan Pollen Negative    25. Pine Mix Negative    26. Walnut, Black Pollen Negative    27. Red Mulberry Negative    28. Ash Mix Negative    29. Birch Mix Negative    30. Beech American Negative    31. Cottonwood, Guinea-Bissau Negative    32. Hickory, White Negative    33. Maple Mix Negative    34. Oak, Guinea-[place]iss[place] Mix Negative    35. Sycamore Eastern Negative    36. Alternaria Alternata Negative    37. Cladosporium Herbarum Negative    38. Aspergillus Mix Negative    39. Penicillium Mix Negative    40. Bipolaris Sorokiniana (Helminthosporium) Negative    41. Drechslera Spicifera (Curvularia) Negative    42. Mucor Plumbeus Negative    43. Fusarium Moniliforme Negative    44. Aureobasidium Pullulans (pullulara) 2+    45. Rhizopus  Oryzae 2+    46. Botrytis Cinera 2+    47. Epicoccum Nigrum Negative    48. Phoma Betae Negative    49. Dust Mite Mix Negative    50. Cat Hair 10,000 BAU/ml Negative    51.  Dog Epithelia Negative    52. Mixed Feathers Negative    53. Horse Epithelia Negative    54. Cockroach, German Negative    55. Tobacco Leaf Negative             Intradermal - 07/31/23 1502     Time Antigen Placed 1502    Allergen Manufacturer Floyd Hutchinson    Location Arm    Number of Test 13    Control Negative    Bahia Negative    French Southern Territories Negative    7 Grass Negative    Ragweed Mix Negative    Weed Mix Negative    Tree Mix Negative    Mold 1 Negative    Mold 2 Negative    Mold 3 Negative    Mite Mix Negative    Cat Negative    Dog Negative    Cockroach Negative  Allergy testing results were read and interpreted by myself, documented by clinical staff.  Patient provided with copy of allergy testing along with avoidance measures when indicated.   Jonathon Neighbors, MD  [facility] 

## 2023-07-31 NOTE — Patient Instructions (Addendum)
 Chronic rhinitis-allergic vs nonallergic (viral, etc) Symptoms occurred post-cat exposure and during  peak pollen season. Differential includes viral infection vs allergic rhinitis. No history of allergies or asthma. - allergy skin testing 07/31/23: borderline to Johnson grass and minor molds, IDs negative-do not suspect this would explain your symptoms. - Consider Flonase daily for nasal drainage. Can use 1 spray in each nostril twice daily.   Penicillin and cephalosporin allergy Hives occurred with penicillin and cephalosporins over 25 years ago. -consider separate appointment for penicillin testing followed by graded oral challenge if negative  -80% of individuals outgrow this allergy in a 10 year span  Follow up : for penicillin allergy testing at your convenience.  It was a pleasure seeing you again in clinic today! Thank you for allowing me to participate in your care.   Airborne Adult Perc - 07/31/23 1426     Time Antigen Placed 1426    Allergen Manufacturer Floyd Hutchinson    Location Back    Number of Test 55    2. Control-Histamine 3+    3. Bahia Negative    4. French Southern Territ[place] Negative    5. Johnson 2+    6. Kentucky  Blue Negative    7. Meadow Fescue Negative    8. Perennial Rye Negative    9. Timothy Negative    10. Ragweed Mix Negative    11. Cocklebur Negative    12. Plantain,  English Negative    13. Baccharis Negative    14. Dog Fennel Negative    15. Russian Thistle Negative    16. Lamb's Quarters Negative    17. Sheep Sorrell Negative    18. Rough Pigweed Negative    19. Marsh Elder, Rough Negative    20. Mugwort, Common Negative    21. Box, Elder Negative    22. Cedar, red Negative    23. Sweet Gum Negative    24. Pecan Pollen Negative    25. Pine Mix Negative    26. Walnut, Black Pollen Negative    27. Red Mulberry Negative    28. Ash Mix Negative    29. Birch Mix Negative    30. Beech American Negative    31. Cottonwood, Guinea-Bissau Negative    32. Hickory, White  Negative    33. Maple Mix Negative    34. Oak, [place] Mix Negative    35. Sycamore Eastern Negative    36. Alternaria Alternata Negative    37. Cladosporium Herbarum Negative    38. Aspergillus Mix Negative    39. Penicillium Mix Negative    40. Bipolaris Sorokiniana (Helminthosporium) Negative    41. Drechslera Spicifera (Curvularia) Negative    42. Mucor Plumbeus Negative    43. Fusarium Moniliforme Negative    44. Aureobasidium Pullulans (pullulara) 2+    45. Rhizopus Oryzae 2+    46. Botrytis Cinera 2+    47. Epicoccum Nigrum Negative    48. Phoma Betae Negative    49. Dust Mite Mix Negative    50. Cat Hair 10,000 BAU/ml Negative    51.  Dog Epithelia Negative    52. Mixed Feathers Negative    53. Horse Epithelia Negative    54. Cockroach, German Negative    55. Tobacco Leaf Negative             Intradermal - 07/31/23 1502     Time Antigen Placed 1502    Allergen Manufacturer Floyd Hutchinson    Location Arm    Number of Test 13  Control Negative    Bahia Negative    [place] Territ[place] Negative    7 Grass Negative    Ragweed Mix Negative    Weed Mix Negative    Tree Mix Negative    Mold 1 Negative    Mold 2 Negative    Mold 3 Negative    Mite Mix Negative    Cat Negative    Dog Negative    Cockroach Negative

## 2023-08-02 ENCOUNTER — Other Ambulatory Visit: Payer: Self-pay | Admitting: Obstetrics and Gynecology

## 2023-08-02 DIAGNOSIS — Z1231 Encounter for screening mammogram for malignant neoplasm of breast: Secondary | ICD-10-CM

## 2023-08-11 DIAGNOSIS — F32A Depression, unspecified: Secondary | ICD-10-CM | POA: Diagnosis not present

## 2023-08-11 DIAGNOSIS — F4320 Adjustment disorder, unspecified: Secondary | ICD-10-CM | POA: Diagnosis not present

## 2023-08-23 DIAGNOSIS — Z01419 Encounter for gynecological examination (general) (routine) without abnormal findings: Secondary | ICD-10-CM | POA: Diagnosis not present

## 2023-08-23 DIAGNOSIS — Z6824 Body mass index (BMI) 24.0-24.9, adult: Secondary | ICD-10-CM | POA: Diagnosis not present

## 2023-08-25 DIAGNOSIS — F4320 Adjustment disorder, unspecified: Secondary | ICD-10-CM | POA: Diagnosis not present

## 2023-08-25 DIAGNOSIS — F32A Depression, unspecified: Secondary | ICD-10-CM | POA: Diagnosis not present

## 2023-09-05 DIAGNOSIS — F4320 Adjustment disorder, unspecified: Secondary | ICD-10-CM | POA: Diagnosis not present

## 2023-09-05 DIAGNOSIS — F32A Depression, unspecified: Secondary | ICD-10-CM | POA: Diagnosis not present

## 2023-09-18 ENCOUNTER — Ambulatory Visit

## 2023-09-20 DIAGNOSIS — F4320 Adjustment disorder, unspecified: Secondary | ICD-10-CM | POA: Diagnosis not present

## 2023-09-20 DIAGNOSIS — F32A Depression, unspecified: Secondary | ICD-10-CM | POA: Diagnosis not present

## 2023-09-21 ENCOUNTER — Ambulatory Visit
Admission: RE | Admit: 2023-09-21 | Discharge: 2023-09-21 | Disposition: A | Discharge status: Home / Self Care | Source: Ambulatory Visit | Attending: Obstetrics and Gynecology | Admitting: Obstetrics and Gynecology

## 2023-09-21 ENCOUNTER — Other Ambulatory Visit: Payer: Self-pay | Admitting: Obstetrics and Gynecology

## 2023-09-21 DIAGNOSIS — Z1231 Encounter for screening mammogram for malignant neoplasm of breast: Secondary | ICD-10-CM

## 2023-09-22 DIAGNOSIS — F32A Depression, unspecified: Secondary | ICD-10-CM | POA: Diagnosis not present

## 2023-09-22 DIAGNOSIS — F4320 Adjustment disorder, unspecified: Secondary | ICD-10-CM | POA: Diagnosis not present

## 2023-10-06 DIAGNOSIS — F4320 Adjustment disorder, unspecified: Secondary | ICD-10-CM | POA: Diagnosis not present

## 2023-10-06 DIAGNOSIS — F32A Depression, unspecified: Secondary | ICD-10-CM | POA: Diagnosis not present

## 2023-10-20 DIAGNOSIS — F4320 Adjustment disorder, unspecified: Secondary | ICD-10-CM | POA: Diagnosis not present

## 2023-10-20 DIAGNOSIS — F32A Depression, unspecified: Secondary | ICD-10-CM | POA: Diagnosis not present

## 2023-11-01 DIAGNOSIS — Z13820 Encounter for screening for osteoporosis: Secondary | ICD-10-CM | POA: Diagnosis not present

## 2023-11-01 DIAGNOSIS — R6882 Decreased libido: Secondary | ICD-10-CM | POA: Diagnosis not present

## 2023-11-10 DIAGNOSIS — F4320 Adjustment disorder, unspecified: Secondary | ICD-10-CM | POA: Diagnosis not present

## 2023-11-10 DIAGNOSIS — F32A Depression, unspecified: Secondary | ICD-10-CM | POA: Diagnosis not present

## 2023-11-24 DIAGNOSIS — F4320 Adjustment disorder, unspecified: Secondary | ICD-10-CM | POA: Diagnosis not present

## 2023-11-24 DIAGNOSIS — F32A Depression, unspecified: Secondary | ICD-10-CM | POA: Diagnosis not present

## 2023-11-30 DIAGNOSIS — F331 Major depressive disorder, recurrent, moderate: Secondary | ICD-10-CM | POA: Diagnosis not present

## 2023-11-30 DIAGNOSIS — F419 Anxiety disorder, unspecified: Secondary | ICD-10-CM | POA: Diagnosis not present

## 2023-12-08 DIAGNOSIS — F32A Depression, unspecified: Secondary | ICD-10-CM | POA: Diagnosis not present

## 2023-12-08 DIAGNOSIS — F4320 Adjustment disorder, unspecified: Secondary | ICD-10-CM | POA: Diagnosis not present

## 2023-12-14 DIAGNOSIS — N771 Vaginitis, vulvitis and vulvovaginitis in diseases classified elsewhere: Secondary | ICD-10-CM | POA: Diagnosis not present

## 2023-12-14 DIAGNOSIS — N760 Acute vaginitis: Secondary | ICD-10-CM | POA: Diagnosis not present

## 2023-12-22 DIAGNOSIS — F4320 Adjustment disorder, unspecified: Secondary | ICD-10-CM | POA: Diagnosis not present

## 2023-12-22 DIAGNOSIS — F32A Depression, unspecified: Secondary | ICD-10-CM | POA: Diagnosis not present

## 2024-01-16 DIAGNOSIS — F419 Anxiety disorder, unspecified: Secondary | ICD-10-CM | POA: Diagnosis not present

## 2024-01-16 DIAGNOSIS — F331 Major depressive disorder, recurrent, moderate: Secondary | ICD-10-CM | POA: Diagnosis not present

## 2024-01-19 DIAGNOSIS — F4320 Adjustment disorder, unspecified: Secondary | ICD-10-CM | POA: Diagnosis not present

## 2024-01-19 DIAGNOSIS — F32A Depression, unspecified: Secondary | ICD-10-CM | POA: Diagnosis not present

## 2024-02-02 DIAGNOSIS — F32A Depression, unspecified: Secondary | ICD-10-CM | POA: Diagnosis not present

## 2024-02-02 DIAGNOSIS — F4320 Adjustment disorder, unspecified: Secondary | ICD-10-CM | POA: Diagnosis not present

## 2024-02-19 DIAGNOSIS — F331 Major depressive disorder, recurrent, moderate: Secondary | ICD-10-CM | POA: Diagnosis not present

## 2024-02-19 DIAGNOSIS — F419 Anxiety disorder, unspecified: Secondary | ICD-10-CM | POA: Diagnosis not present

## 2024-03-01 DIAGNOSIS — F32A Depression, unspecified: Secondary | ICD-10-CM | POA: Diagnosis not present

## 2024-03-01 DIAGNOSIS — F4320 Adjustment disorder, unspecified: Secondary | ICD-10-CM | POA: Diagnosis not present

## 2024-03-05 DIAGNOSIS — D225 Melanocytic nevi of trunk: Secondary | ICD-10-CM | POA: Diagnosis not present

## 2024-03-05 DIAGNOSIS — L219 Seborrheic dermatitis, unspecified: Secondary | ICD-10-CM | POA: Diagnosis not present

## 2024-03-05 DIAGNOSIS — L578 Other skin changes due to chronic exposure to nonionizing radiation: Secondary | ICD-10-CM | POA: Diagnosis not present

## 2024-03-05 DIAGNOSIS — L719 Rosacea, unspecified: Secondary | ICD-10-CM | POA: Diagnosis not present

## 2024-03-11 ENCOUNTER — Ambulatory Visit (INDEPENDENT_AMBULATORY_CARE_PROVIDER_SITE_OTHER): Admitting: Emergency Medicine

## 2024-03-11 VITALS — BP 108/70 | HR 67 | Resp 16 | Ht 63.0 in | Wt 134.0 lb

## 2024-03-11 DIAGNOSIS — R590 Localized enlarged lymph nodes: Secondary | ICD-10-CM

## 2024-03-11 DIAGNOSIS — J029 Acute pharyngitis, unspecified: Secondary | ICD-10-CM

## 2024-03-11 MED ORDER — AZITHROMYCIN 250 MG PO TABS: ORAL_TABLET | ORAL | 0 refills | Status: AC

## 2024-03-11 NOTE — Patient Instructions (Addendum)
 Likely viral infection to start this out, which is not expected to be contagious for the holiday Antibiotic to cover for possible secondary bacterial infection given the isolated lumph node swelling on the right side.  Recheck the lymph node if swelling not improving over 3 weeks.  Add salt water gargles, arm and hammer extra strength saline spray in the nose Continue teas especially throat coat, try mucinex honey lemon numbing lozenges Consider flonase if fluid behind eardrums is bothering you, otherwise it should self-resolve

## 2024-03-11 NOTE — Progress Notes (Signed)
" ° °  Assessment & Plan:  1. Acute sore throat (Primary) See below. 1/4 centor criteria in adult. No additional testing indicated.   2. Cervical adenopathy, right sided Consider associated bacterial infection with unilateral LAD. Recheck if no better in 3 weeks.  - azithromycin  (ZITHROMAX ) 250 MG tablet; Take 2 tablets on day 1, then 1 tablet daily on days 2 through 5  Dispense: 6 tablet; Refill: 0     No results found for any visits on 03/11/24.  Patient Instructions  Likely viral infection to start this out, which is not expected to be contagious for the holiday Antibiotic to cover for possible secondary bacterial infection given the isolated lumph node swelling on the right side.  Recheck the lymph node if swelling not improving over 3 weeks.  Add salt water gargles, arm and hammer extra strength saline spray in the nose Continue teas especially throat coat, try mucinex honey lemon numbing lozenges Consider flonase if fluid behind eardrums is bothering you, otherwise it should self-resolve   Corean Geralds, MSPAS, PA-C   Subjective:  Sore Throat (Sore throat (mostly on the right side), slight congestion, fatigue and cough x 4 days. Denies fever and body aches. At home COVID test was negative. )   HPI: Audrey Wright is a 58 y.o. female presenting with acute sore throat Started 5 days ago No fevers at any time Mild associated congestion cough and fatigue Negative COVID test at home No known close contacts who are ill ST only right side, with tender gland.  Fluids tea and broth tried  ROS: Negative unless specifically indicated above in HPI.   Relevant past medical history reviewed and updated as indicated.   Allergies and medications reviewed and updated.  Current Medications[1]  Allergies[2]    Objective:   Vitals:   03/11/24 1248  BP: 108/70  Pulse: 67  Resp: 16  Height: 5' 3 (1.6 m)  Weight: 134 lb (60.8 kg)  SpO2: 97%  BMI (Calculated): 23.74       Gen: appears well, alert, INAD Ears: Right canal clear. Right TM serous effusion. Left canal clear. Left TM serous effusion.  Nose: normal and patent, no erythema, discharge or polyps Mouth: Oral mucosa moist. Throat: redness to tonsillar pillars and right OP wall no edema normal phonation.  No exudate or ulcerations.  Neck: supple single swollen gland tender 1.5cm right ant cervical Heart RRR Lungs: Respiratory effort: normal.  clear to auscultation, no wheezes, rales, or rhonchi Skin: Warm and dry without acute rash to exposed areas.           [1]  Current Outpatient Medications:    azithromycin  (ZITHROMAX ) 250 MG tablet, Take 2 tablets on day 1, then 1 tablet daily on days 2 through 5, Disp: 6 tablet, Rfl: 0   buPROPion (WELLBUTRIN XL) 150 MG 24 hr tablet, Take 150 mg by mouth every morning., Disp: , Rfl:    FLUoxetine (PROZAC) 10 MG capsule, Take 10 mg by mouth daily., Disp: , Rfl:    FINACEA 15 % cream, , Disp: , Rfl:    ketoconazole (NIZORAL) 2 % shampoo, Apply 1 Application topically as needed for irritation. (Patient not taking: Reported on 03/11/2024), Disp: , Rfl:    zolpidem (AMBIEN) 5 MG tablet, Take 5 mg by mouth at bedtime as needed. (Patient not taking: Reported on 03/11/2024), Disp: , Rfl:  [2]  Allergies Allergen Reactions   Cephalosporins Hives   Penicillins Hives   "

## 2024-03-20 ENCOUNTER — Ambulatory Visit: Payer: Self-pay

## 2024-03-20 NOTE — Telephone Encounter (Signed)
 FYI Only or Action Required?: FYI only for provider: appointment scheduled on 03/26/24; soonest available and pt did not want to go to other location.  Patient was last seen in primary care on 03/11/2024 by Waddell Krabbe, PA-C.  Called Nurse Triage reporting Otalgia.  Symptoms began a week ago.  Interventions attempted: Prescription medications: Zpak.  Symptoms are: unchanged.  Triage Disposition: See Physician Within 24 Hours  Patient/caregiver understands and will follow disposition?: Yes    Copied from CRM #[id number]. Topic: Appointments - Appointment Scheduling >> Mar 20, 2024 12:42 PM Alfonso HERO wrote: Patient ear and lymphoid pain has gotten worse and the antibiotics didn't work    Reason for Disposition  Earache  (Exceptions: Brief ear pain of lasting less than 60 minutes, or earache occurring during air travel.)  Answer Assessment - Initial Assessment Questions Pt called in to report worsening R ear pain since appt 12/22; pt seen for acute sore throat and prescribed Azithromycin . Pt states all other symptoms have subsided and she is feeling better. Pt states that R ear pain never resolved, rating pain 4/10. Pt denies any fever, drainage from ear, neck pain. Pt has not tried any at home interventions. Appointment scheduled for evaluation. Patient agrees with plan of care, and will call back if anything changes, or if symptoms worsen.     1. LOCATION: Which ear is involved?     R ear   2. ONSET: When did the ear pain start?      Ongoing since appt 12/22  3. SEVERITY: How bad is the pain?  (Scale 1-10; mild, moderate or severe)     4/10  4. URI SYMPTOMS: Do you have a runny nose or cough?     No; all other symptoms have improved with Azithromycin    5. FEVER: Do you have a fever? If Yes, ask: What is your temperature, how was it measured, and when did it start?     No    7. OTHER SYMPTOMS: Do you have any other symptoms? (e.g., decreased hearing,  dizziness, headache, stiff neck, vomiting)     None  Protocols used: Earache-A-AH

## 2024-03-22 NOTE — Telephone Encounter (Signed)
 Noted. Patient scheduled for appointment on 03/26/24.

## 2024-03-26 ENCOUNTER — Ambulatory Visit: Admitting: Family Medicine

## 2024-03-26 ENCOUNTER — Encounter: Payer: Self-pay | Admitting: Family Medicine

## 2024-03-26 DIAGNOSIS — J040 Acute laryngitis: Secondary | ICD-10-CM

## 2024-03-26 MED ORDER — PREDNISONE 5 MG PO TABS: ORAL_TABLET | ORAL | 0 refills | Status: DC

## 2024-03-31 ENCOUNTER — Encounter: Payer: Self-pay | Admitting: Family Medicine

## 2024-03-31 NOTE — Progress Notes (Signed)
 "    Patient Care Team: Nche, Roselie Rockford, NP as PCP - General (Internal Medicine) Robinson Pao, MD as Consulting Physician (Dermatology)  Diagnoses and Orders:   1. Laryngitis, acute    Meds ordered this encounter  Medications   predniSONE  (DELTASONE ) 5 MG tablet    Sig: 6-5-4-3-2-1-off    Dispense:  21 tablet    Refill:  0   Assessment & Plan:   Assessment and Plan Assessment & Plan Acute laryngitis Persistent right-sided throat pain and voice loss post-viral illness. No bacterial infection. Likely vocal cord irritation. Symptoms may persist for weeks. - Prescribed prednisone  taper: 6 pills day 1, then decrease by 1 pill daily. - Advised warm liquids like tea with honey. - Discussed prednisone  side effects: increased energy, insomnia, hunger, hot flashes. - Advised rest and possible work absence if unwell.  Insomnia Chronic insomnia with concerns about Ambien due to family dependency history. Previous hormone therapy not tolerated. Discussed progesterone for sleep post-hysterectomy. - Consider progesterone therapy discussion with GYN. - Use Tylenol PM for sleep aid during prednisone  treatment. - Explore alternative sleep medications and consult sleep specialist if needed.  Geni Shutter, DO, MS, FAAFP, Dipl. [facility] at [facility] [address] [place], [address] Dept: [phone] Dept Fax: [phone]  Subjective:   History of Present Illness Audrey Wright is a 59 year old female who presents with persistent throat and ear pain.  Oropharyngeal and otologic symptoms - Persistent right-sided throat pain for three weeks - Initial symptoms included scratchy, sore throat and inflamed lymph node - Right ear discomfort described as air pockets - Completed azithromycin  with improvement in throat pain and energy - Ongoing right-sided pain, hoarseness, and intermittent voice loss - No fever or significant postnasal mucus -  Lymph node is smaller but occasionally painful  Sleep disturbance - Chronic difficulty sleeping - Prescribed Ambien but avoids use due to concern about dependency  Gynecologic medication use - Uses vaginal estrogen - Not on systemic hormone therapy  Review of Systems: Negative, with the exception of above mentioned in HPI.  History:   Reviewed by clinician on day of visit: allergies, medications, problem list, medical history, surgical history, family history, social history, and previous encounter notes.  Medications:   Show/hide medication list[1] Allergies[2]  Objective:   BP 104/68 (BP Location: Right Arm, Cuff Size: Normal)   Pulse 61   Temp 97.6 F (36.4 C) (Oral)   Ht 5' 3 (1.6 m)   Wt 134 lb 6.4 oz (61 kg)   SpO2 99%   BMI 23.81 kg/m   Physical Exam Constitutional:      General: She is not in acute distress.    Appearance: She is well-developed.  HENT:     Head: Normocephalic and atraumatic.     Right Ear: Tympanic membrane normal.     Left Ear: Tympanic membrane normal.     Mouth/Throat:     Mouth: Mucous membranes are moist.     Pharynx: No oropharyngeal exudate or posterior oropharyngeal erythema.  Eyes:     Conjunctiva/sclera: Conjunctivae normal.  Cardiovascular:     Rate and Rhythm: Normal rate and regular rhythm.     Heart sounds: Normal heart sounds.  Pulmonary:     Effort: Pulmonary effort is normal.     Breath sounds: Normal breath sounds.  Neurological:     General: No focal deficit present.     Mental Status: She is alert.  Psychiatric:  Behavior: Behavior normal.    Attestations:   Reviewed by clinician on day of visit: allergies, medications, problem list, medical history, surgical history, family history, social history, and previous encounter notes.  The patient is being seen today for an acute visit.  Geni Shutter, DO, MS, FAAFP, Dipl. [facility] at [facility] [address] [place], [address] Dept: [phone] Dept Fax: [phone]    [1]  Outpatient Medications Prior to Visit  Medication Sig   buPROPion (WELLBUTRIN XL) 150 MG 24 hr tablet Take 150 mg by mouth every morning.   FLUoxetine (PROZAC) 10 MG capsule Take 10 mg by mouth daily.   ketoconazole (NIZORAL) 2 % shampoo Apply 1 Application topically as needed for irritation.   [DISCONTINUED] FINACEA 15 % cream  (Patient not taking: Reported on 03/26/2024)   [DISCONTINUED] zolpidem (AMBIEN) 5 MG tablet Take 5 mg by mouth at bedtime as needed. (Patient not taking: Reported on 03/11/2024)   No facility-administered medications prior to visit.  [2]  Allergies Allergen Reactions   Cephalosporins Hives   Penicillins Hives   "

## 2024-04-04 ENCOUNTER — Encounter: Payer: Self-pay | Admitting: Family Medicine

## 2024-04-04 ENCOUNTER — Ambulatory Visit: Admitting: Family Medicine

## 2024-04-04 VITALS — BP 122/84 | HR 52 | Ht 63.0 in | Wt 137.6 lb

## 2024-04-04 DIAGNOSIS — J040 Acute laryngitis: Secondary | ICD-10-CM

## 2024-04-04 NOTE — Progress Notes (Signed)
 "    Patient Care Team: Nche, Roselie Rockford, NP as PCP - General (Internal Medicine) Robinson Pao, MD as Consulting Physician (Dermatology)  Diagnoses and Orders:   1. Laryngitis, persistent, refractory to treatment    Orders Placed This Encounter  Procedures   Ambulatory referral to ENT   Assessment & Plan:   Assessment & Plan Persistent laryngeal symptoms with hoarseness. Patient with >3 weeks of persistent right-sided throat pain, hoarseness, and intermittent voice loss. Partial improvement with azithromycin ; no improvement with systemic corticosteroids. Symptoms are unilateral and persistent, meeting red-flag criteria for expedited evaluation to exclude vocal cord pathology or malignancy.  Plan:  Urgent ENT referral placed for laryngoscopy/vocal cord evaluation  Referral explicitly marked URGENT due to red-flag features: hoarseness >3 weeks, unilateral throat pain, intermittent voice loss, failure of steroid therapy  Voice rest and avoidance of vocal strain  Return precautions: worsening hoarseness, dysphagia/odynophagia, dyspnea/stridor, hemoptysis, unintentional weight loss, or complete voice loss  Geni Shutter, DO, MS, FAAFP, Dipl. [facility] at [facility] [address] [place], [address] Dept: [phone] Dept Fax: [phone]  Subjective:   History of Present Illness Audrey Wright is a 59 year old female who presents with persistent right-sided throat pain and hoarseness.  Right-sided throat pain - Persistent for three weeks, onset after viral illness on December 22 - Initially associated with sore throat, fatigue, and right ear discomfort - Partial relief with azithromycin , but right-sided pain persists - Prednisone  provided little relief  Hoarseness and voice changes - Hoarseness and intermittent voice loss since January 3 - No associated fever, mucus production, or dysphagia - Able to eat normally  Cervical  lymphadenopathy - Enlarged, tender lymph node noted at onset - Lymph node has decreased in size but remains intermittently painful  Abnormal sensation with sneezing - Abnormal sensation when sneezing, described as only air coming out without the usual sound  Review of Systems: Negative, with the exception of above mentioned in HPI.  History:   Reviewed by clinician on day of visit: allergies, medications, problem list, medical history, surgical history, family history, social history, and previous encounter notes.  Medications:   Show/hide medication list[1] Allergies[2]  Objective:   BP 122/84 (BP Location: Right Arm, Cuff Size: Normal)   Pulse (!) 52   Ht 5' 3 (1.6 m)   Wt 137 lb 9.6 oz (62.4 kg)   SpO2 99%   BMI 24.37 kg/m   Physical Exam HENT:     Head: Normocephalic and atraumatic.     Comments: Voice: Hoarse with phonation-associated throat discomfort.    Ears:     Comments: Ears: External auditory canals clear. Tympanic membranes appear dull with reduced light reflex, without erythema, bulging, or visible effusion.    Nose: Nose normal.     Mouth/Throat:     Mouth: Mucous membranes are moist.     Pharynx: Oropharynx is clear.  Neurological:     Mental Status: She is alert.    Geni Shutter, DO, MS, FAAFP, Dipl. [facility] at [facility] [address] [place], [address] Dept: [phone] Dept Fax: [phone]     [1]  Outpatient Medications Prior to Visit  Medication Sig   buPROPion (WELLBUTRIN XL) 150 MG 24 hr tablet Take 150 mg by mouth every morning.   FLUoxetine (PROZAC) 10 MG capsule Take 10 mg by mouth daily.   ketoconazole (NIZORAL) 2 % shampoo Apply 1 Application topically as needed for irritation.   [DISCONTINUED] predniSONE  (DELTASONE )  5 MG tablet 6-5-4-3-2-1-off   No facility-administered medications prior to visit.  [2]  Allergies Allergen Reactions   Cephalosporins Hives   Penicillins Hives   "

## 2024-04-16 ENCOUNTER — Other Ambulatory Visit (INDEPENDENT_AMBULATORY_CARE_PROVIDER_SITE_OTHER): Payer: Self-pay | Admitting: Otolaryngology

## 2024-04-16 ENCOUNTER — Encounter (INDEPENDENT_AMBULATORY_CARE_PROVIDER_SITE_OTHER): Payer: Self-pay | Admitting: Otolaryngology

## 2024-04-16 ENCOUNTER — Ambulatory Visit (INDEPENDENT_AMBULATORY_CARE_PROVIDER_SITE_OTHER): Admitting: Otolaryngology

## 2024-04-16 VITALS — BP 134/86 | HR 66 | Ht 63.0 in | Wt 135.0 lb

## 2024-04-16 DIAGNOSIS — R490 Dysphonia: Secondary | ICD-10-CM

## 2024-04-16 DIAGNOSIS — K219 Gastro-esophageal reflux disease without esophagitis: Secondary | ICD-10-CM

## 2024-04-16 MED ORDER — PANTOPRAZOLE SODIUM 40 MG PO TBEC: 40.0000 mg | DELAYED_RELEASE_TABLET | Freq: Two times a day (BID) | ORAL | 1 refills | Status: DC

## 2024-04-16 NOTE — Progress Notes (Signed)
 Reason for Consult: Hoarseness Referring Physician: Dr. Prentiss Arlean KATHEE Audrey Wright is an 59 y.o. female.  HPI: History of hoarseness since December 27.  She has not had this previously.  She had an upper respiratory infection at the time of onset.  She has not had any dysphagia or odynophagia.  She has no sore throat or pain.  She does not have pain with speaking.  She has not noticed the hoarseness is any worse but it is pretty consistent all the time every day.  She was treated with a round of prednisone .  No nasal symptoms or congestion.  Past Medical History:  Diagnosis Date   Allergy     Anxiety and depression    Dysplastic nevus of left upper extremity 03/21/2014   w/ moderate to severe melanocytic aptypia, left upper arm   Rosacea     Past Surgical History:  Procedure Laterality Date   ABDOMINAL HYSTERECTOMY  2014   AUGMENTATION MAMMAPLASTY Right 1982   BREAST CYST ASPIRATION  2012   COLONOSCOPY     COSMETIC SURGERY  1986   Breast Surgery d/t birth defect   LAPAROSCOPY     pelvic-- prior to 2000   PLACEMENT OF BREAST IMPLANTS Right 1987   POLYPECTOMY  2015   TA X 1   VAGINAL HYSTERECTOMY  2004   no oophorectomy    Family History  Problem Relation Age of Onset   Hypertension Mother    Hyperlipidemia Mother    Squamous cell carcinoma Mother    Cancer Mother    Breast cancer Maternal Grandmother 48 - 69   Pancreatic cancer Maternal Grandfather    Colon cancer Neg Hx    CAD Neg Hx    Stroke Neg Hx    Rectal cancer Neg Hx    Stomach cancer Neg Hx    Esophageal cancer Neg Hx    Colon polyps Neg Hx     Social History:  reports that she has never smoked. She has never been exposed to tobacco smoke. She has never used smokeless tobacco. She reports current alcohol use. She reports that she does not use drugs.  Allergies: Allergies[1]   No results found for this or any previous visit (from the past 48 hours).  No results found.  ROS Blood pressure (!) 146/83, pulse  66, height 5' 3 (1.6 m), weight 135 lb (61.2 kg), SpO2 97%. Physical Exam Constitutional:      Appearance: Normal appearance.  HENT:     Head: Normocephalic and atraumatic.     Right Ear: Tympanic membrane is without lesions and middle ear aerated, ear canal and external ear normal.     Left Ear: Tympanic membrane is without lesions and middle ear aerated, ear canal and external ear normal.     Nose: Nose without deviation of septum.  Turbinates with mild hypertrophy, No significant swelling or masses.     Oral cavity/oropharynx: Mucous membranes are moist. No lesions or masses    Larynx: normal voice. Mirror attempted without success    Eyes:     Extraocular Movements: Extraocular movements intact.     Conjunctiva/sclera: Conjunctivae normal.     Pupils: Pupils are equal, round, and reactive to light.  Cardiovascular:     Rate and Rhythm: Normal rate.  Pulmonary:     Effort: Pulmonary effort is normal.  Musculoskeletal:     Cervical back: Normal range of motion and neck supple. No rigidity.  Lymphadenopathy:     Cervical: No cervical adenopathy or  masses.salivary glands without lesions. .     Salivary glands- no mass or swelling Neurological:     Mental Status: He is alert. CN 2-12 intact. No nystagmus  Flexible fibroptic laryngoscopy  Patient was informed of risks, benefits, and options. All questions answered. Consent obtained.   The scope was passed through the nose and tracked into the nasopharynx. The nasopharynx without lesions or masses. The scope was positioned over the base of tongue and epiglottis. There was no obvious lesions or significant swelling and any of the laryngeal or pharyngeal structures. The vocal cords move well.  The scope was not able to be easily pushed down to the point that I could get over the top of the epiglottis so my view of the vocal cords was above the epiglottis.  I got a good view of most all of the cord but not the interarytenoid area and the  very anterior portion of the vocal cords.  Patient was informed of this but she did not want to proceed with what it took to get the scope further and has I tried it once and she had a significant gag and discomfort.  The subglottis has minimal visualization . The scope was removed without difficulty and patient tolerated well.      Assessment/Plan: Hoarseness-she does not have any obvious lesions on fiberoptic exam but I cannot see all of the vocal cord because something was hanging the scope up and she would not tolerate any pressure whatsoever to get it past the epiglottis.  I told her that we may have to look again but I could not see well enough to see there was no obvious hematoma, lesions, or polyps on the vocal cords.  It looks like both vocal cords move normally.  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  I started Protonix  twice daily and she will follow-up in 3 weeks.  She will follow-up sooner if any new problems arise.  [name] Notice 04/16/2024, 1:27 PM        [1]  Allergies Allergen Reactions   Cephalosporins Hives   Penicillins Hives

## 2024-05-14 ENCOUNTER — Ambulatory Visit (INDEPENDENT_AMBULATORY_CARE_PROVIDER_SITE_OTHER): Admitting: Otolaryngology

## 2024-05-23 ENCOUNTER — Encounter: Admitting: Nurse Practitioner
